# Patient Record
Sex: Female | Born: 1950 | Race: White | Hispanic: No | Smoking: Never smoker
Health system: Southern US, Community
[De-identification: ages and names within clinical notes are randomized; demographics above are authoritative.]

## PROBLEM LIST (undated history)

## (undated) DIAGNOSIS — N1832 Chronic kidney disease, stage 3b: Secondary | ICD-10-CM

## (undated) DIAGNOSIS — R0600 Dyspnea, unspecified: Secondary | ICD-10-CM

## (undated) DIAGNOSIS — E875 Hyperkalemia: Secondary | ICD-10-CM

## (undated) DIAGNOSIS — R519 Headache, unspecified: Secondary | ICD-10-CM

## (undated) DIAGNOSIS — E119 Type 2 diabetes mellitus without complications: Secondary | ICD-10-CM

## (undated) DIAGNOSIS — M1990 Unspecified osteoarthritis, unspecified site: Secondary | ICD-10-CM

## (undated) DIAGNOSIS — E785 Hyperlipidemia, unspecified: Secondary | ICD-10-CM

## (undated) DIAGNOSIS — I10 Essential (primary) hypertension: Secondary | ICD-10-CM

## (undated) HISTORY — DX: Hyperkalemia: E87.5

## (undated) HISTORY — PX: EYE SURGERY: SHX253

## (undated) HISTORY — DX: Chronic kidney disease, stage 3b: N18.32

## (undated) HISTORY — PX: CHOLECYSTECTOMY: SHX55

## (undated) HISTORY — PX: BLADDER SUSPENSION: SHX72

## (undated) HISTORY — PX: CATARACT EXTRACTION W/ INTRAOCULAR LENS  IMPLANT, BILATERAL: SHX1307

## (undated) HISTORY — PX: ABDOMINAL HYSTERECTOMY: SHX81

---

## 2004-11-06 ENCOUNTER — Ambulatory Visit: Payer: Self-pay | Admitting: Family Medicine

## 2006-04-01 ENCOUNTER — Other Ambulatory Visit: Payer: Self-pay

## 2006-04-01 ENCOUNTER — Emergency Department: Payer: Self-pay | Admitting: Emergency Medicine

## 2006-08-13 ENCOUNTER — Ambulatory Visit: Payer: Self-pay

## 2008-07-10 ENCOUNTER — Ambulatory Visit: Payer: Self-pay | Admitting: Obstetrics & Gynecology

## 2008-07-11 ENCOUNTER — Ambulatory Visit: Payer: Self-pay | Admitting: Obstetrics & Gynecology

## 2008-12-11 ENCOUNTER — Emergency Department: Payer: Self-pay | Admitting: Emergency Medicine

## 2009-09-15 HISTORY — PX: ABDOMINAL HYSTERECTOMY: SHX81

## 2010-09-15 HISTORY — PX: COLONOSCOPY: SHX174

## 2011-01-05 ENCOUNTER — Emergency Department: Payer: Self-pay | Admitting: Emergency Medicine

## 2011-01-14 ENCOUNTER — Ambulatory Visit: Payer: Self-pay | Admitting: Gastroenterology

## 2011-04-02 ENCOUNTER — Ambulatory Visit: Payer: Self-pay | Admitting: Family Medicine

## 2011-04-16 ENCOUNTER — Ambulatory Visit: Payer: Self-pay | Admitting: Family Medicine

## 2011-05-17 ENCOUNTER — Ambulatory Visit: Payer: Self-pay | Admitting: Family Medicine

## 2011-06-18 ENCOUNTER — Ambulatory Visit: Payer: Self-pay | Admitting: Gastroenterology

## 2011-06-26 ENCOUNTER — Ambulatory Visit: Payer: Self-pay | Admitting: Gastroenterology

## 2011-07-09 ENCOUNTER — Ambulatory Visit: Payer: Self-pay | Admitting: Family Medicine

## 2011-07-17 ENCOUNTER — Ambulatory Visit: Payer: Self-pay | Admitting: Family Medicine

## 2011-07-22 ENCOUNTER — Ambulatory Visit: Payer: Self-pay | Admitting: Surgery

## 2011-07-23 LAB — PATHOLOGY REPORT

## 2012-10-11 ENCOUNTER — Ambulatory Visit: Payer: Self-pay | Admitting: Ophthalmology

## 2012-10-18 ENCOUNTER — Ambulatory Visit: Payer: Self-pay | Admitting: Ophthalmology

## 2013-01-10 ENCOUNTER — Ambulatory Visit: Payer: Self-pay | Admitting: Ophthalmology

## 2014-03-28 ENCOUNTER — Ambulatory Visit: Payer: Self-pay | Admitting: Family Medicine

## 2014-04-28 ENCOUNTER — Ambulatory Visit: Payer: Self-pay | Admitting: Physician Assistant

## 2015-01-05 NOTE — Op Note (Signed)
PATIENT NAME:  Veronica Barajas, Veronica Barajas MR#:  [id number] DATE OF BIRTH:  1950/11/27  DATE OF PROCEDURE:  10/18/2012  PREOPERATIVE DIAGNOSIS:  Cataract, left eye.    POSTOPERATIVE DIAGNOSIS:  Cataract, left eye.  PROCEDURE PERFORMED:  Extracapsular cataract extraction using phacoemulsification with placement of an Alcon SN6CWS, 28.5-diopter posterior chamber lens, serial #[id number].  SURGEON:  Maylon PeppersSteven A. Noheli Melder, MD  ASSISTANT:  None.  ANESTHESIA:  4% lidocaine and 0.75% Marcaine in a 50/50 mixture with 10 units/mL of Vitrase added, given as a peribulbar.   ANESTHESIOLOGIST:  Randall AnGjibertus Van Staveren, MD  COMPLICATIONS:  None.  ESTIMATED BLOOD LOSS:  Less than 1 ml.  DESCRIPTION OF PROCEDURE:  The patient was brought to the operating room and given a peribulbar block.  The patient was then prepped and draped in the usual fashion.  The vertical rectus muscles were imbricated using 5-0 silk sutures.  These sutures were then clamped to the sterile drapes as bridle sutures.  A limbal peritomy was performed extending two clock hours and hemostasis was obtained with cautery.  A partial thickness scleral groove was made at the surgical limbus and dissected anteriorly in a lamellar dissection using an Alcon crescent knife.  The anterior chamber was entered supero-temporally with a Superblade and through the lamellar dissection with a 2.6 mm keratome.  DisCoVisc was used to replace the aqueous and a continuous tear capsulorrhexis was carried out.  Hydrodissection and hydrodelineation were carried out with balanced salt and a 27 gauge canula.  The nucleus was rotated to confirm the effectiveness of the hydrodissection.  Phacoemulsification was carried out using a divide-and-conquer technique.  Total ultrasound time was 1 minutes and 43 seconds with an average power of 24.4 percent and CDE of 50.50.   Irrigation/aspiration was used to remove the residual cortex.  DisCoVisc was used to inflate the capsule  and the internal incision was enlarged to 3 mm with the crescent knife.  The intraocular lens was folded and inserted into the capsular bag using the Ecolablcon Monarch shooter.  Irrigation/aspiration was used to remove the residual DisCoVisc.  Miostat was injected into the anterior chamber through the paracentesis track to inflate the anterior chamber and induce miosis.  The wound was checked for leaks and wound leakage was found.  A single 10-0 nylon suture was placed across the wound. The conjunctiva was closed with cautery and the bridle sutures were removed.  Two drops of 0.3% Vigamox were placed on the eye.   An eye shield was placed on the eye.  The patient was discharged to the recovery room in good condition. ____________________________ Maylon PeppersSteven A. Andray Assefa, MD sad:sb D: 10/18/2012 12:42:06 ET T: 10/18/2012 13:39:07 ET JOB#: [id number]  cc: Viviann SpareSteven A. Arpan Eskelson, MD, <Dictator> Erline LevineSTEVEN A Rhena Glace MD ELECTRONICALLY SIGNED 10/25/2012 13:09

## 2015-01-05 NOTE — Op Note (Signed)
PATIENT NAME:  Veronica Barajas, Veronica Barajas MR#:  [id number] DATE OF BIRTH:  1950-10-15  DATE OF PROCEDURE:  01/10/2013  PREOPERATIVE DIAGNOSIS:  Cataract, right eye.   POSTOPERATIVE DIAGNOSIS:  Cataract, right eye.  PROCEDURE PERFORMED:  Extracapsular cataract extraction using phacoemulsification with placement of an Alcon SN6CWS, 30.0-diopter posterior chamber lens, serial [id number].  SURGEON:  Maylon PeppersSteven A. Ada Woodbury, MD  ASSISTANT:  None.  ANESTHESIA:  4% lidocaine and 0.75% Marcaine in a 50/50 mixture with 10 units/mL of Hylenex added, given as a peribulbar.   ANESTHESIOLOGIST:  Dr. Pernell DupreAdams.  COMPLICATIONS:  None.  ESTIMATED BLOOD LOSS:  Less than 1 ml.  DESCRIPTION OF PROCEDURE:  The patient was brought to the operating room and given a peribulbar block.  The patient was then prepped and draped in the usual fashion.  The vertical rectus muscles were imbricated using 5-0 silk sutures.  These sutures were then clamped to the sterile drapes as bridle sutures.  A limbal peritomy was performed extending two clock hours and hemostasis was obtained with cautery.  A partial thickness scleral groove was made at the surgical limbus and dissected anteriorly in a lamellar dissection using an Alcon crescent knife.  The anterior chamber was entered superonasally with a Superblade and through the lamellar dissection with a 2.6 mm keratome.  DisCoVisc was used to replace the aqueous and a continuous tear capsulorrhexis was carried out.  Hydrodissection and hydrodelineation were carried out with balanced salt and a 27 gauge canula.  The nucleus was rotated to confirm the effectiveness of the hydrodissection.  Phacoemulsification was carried out using a divide-and-conquer technique.  Total ultrasound time was 39 seconds with an average power of 15.5 percent and CDE of 11.91.  Irrigation/aspiration was used to remove the residual cortex.  DisCoVisc was used to inflate the capsule and the internal incision was  enlarged to 3 mm with the crescent knife.  The intraocular lens was folded and inserted into the capsular bag using the AcrySert delivery system.  Irrigation/aspiration was used to remove the residual DisCoVisc.  Miostat was injected into the anterior chamber through the paracentesis track to inflate the anterior chamber and induce miosis. A tenth of a milliliter of cefuroxime was placed into the anterior chamber via the paracentesis tract. The wound was checked for leaks and wound leakage was found.  A single 10-0 suture was placed across the incision, tied and the knot was rotated superiorly.  The conjunctiva was closed with cautery and the bridle sutures were removed.  Two drops of 0.3% Vigamox were placed on the eye.   An eye shield was placed on the eye.  The patient was discharged to the recovery room in good condition. ____________________________ Maylon PeppersSteven A. Johnathon Olden, MD sad:sb D: 01/10/2013 14:13:27 ET T: 01/10/2013 14:57:25 ET JOB#: [id number]  cc: Viviann SpareSteven A. Avyana Puffenbarger, MD, <Dictator> Erline LevineSTEVEN A Mignonne Afonso MD ELECTRONICALLY SIGNED 01/17/2013 9:47

## 2015-04-03 ENCOUNTER — Other Ambulatory Visit: Payer: Self-pay | Admitting: Family Medicine

## 2015-04-03 DIAGNOSIS — Z1239 Encounter for other screening for malignant neoplasm of breast: Secondary | ICD-10-CM

## 2015-04-03 DIAGNOSIS — Z1231 Encounter for screening mammogram for malignant neoplasm of breast: Secondary | ICD-10-CM

## 2015-04-10 ENCOUNTER — Ambulatory Visit: Payer: Self-pay | Attending: Family Medicine

## 2016-07-10 ENCOUNTER — Other Ambulatory Visit: Payer: Self-pay | Admitting: Family Medicine

## 2016-07-10 DIAGNOSIS — Z1239 Encounter for other screening for malignant neoplasm of breast: Secondary | ICD-10-CM

## 2016-08-27 ENCOUNTER — Ambulatory Visit: Payer: Self-pay

## 2017-08-16 ENCOUNTER — Emergency Department
Admission: EM | Admit: 2017-08-16 | Discharge: 2017-08-16 | Disposition: A | Discharge status: Home / Self Care | Payer: 59 | Attending: Emergency Medicine | Admitting: Emergency Medicine

## 2017-08-16 ENCOUNTER — Encounter: Payer: Self-pay | Admitting: Emergency Medicine

## 2017-08-16 ENCOUNTER — Other Ambulatory Visit: Payer: Self-pay

## 2017-08-16 DIAGNOSIS — E119 Type 2 diabetes mellitus without complications: Secondary | ICD-10-CM | POA: Insufficient documentation

## 2017-08-16 DIAGNOSIS — R112 Nausea with vomiting, unspecified: Secondary | ICD-10-CM | POA: Insufficient documentation

## 2017-08-16 DIAGNOSIS — E860 Dehydration: Secondary | ICD-10-CM

## 2017-08-16 DIAGNOSIS — R197 Diarrhea, unspecified: Secondary | ICD-10-CM

## 2017-08-16 HISTORY — DX: Type 2 diabetes mellitus without complications: E11.9

## 2017-08-16 LAB — COMPREHENSIVE METABOLIC PANEL
ALBUMIN: 3.9 g/dL (ref 3.5–5.0)
ALT: 15 U/L (ref 14–54)
ANION GAP: 10 (ref 5–15)
AST: 25 U/L (ref 15–41)
Alkaline Phosphatase: 68 U/L (ref 38–126)
BILIRUBIN TOTAL: 0.4 mg/dL (ref 0.3–1.2)
BUN: 32 mg/dL — ABNORMAL HIGH (ref 6–20)
CHLORIDE: 105 mmol/L (ref 101–111)
CO2: 25 mmol/L (ref 22–32)
Calcium: 9.1 mg/dL (ref 8.9–10.3)
Creatinine, Ser: 1.17 mg/dL — ABNORMAL HIGH (ref 0.44–1.00)
GFR calc Af Amer: 55 mL/min — ABNORMAL LOW (ref >60)
GFR calc non Af Amer: 47 mL/min — ABNORMAL LOW (ref >60)
Glucose, Bld: 145 mg/dL — ABNORMAL HIGH (ref 65–99)
POTASSIUM: 4.3 mmol/L (ref 3.5–5.1)
SODIUM: 140 mmol/L (ref 135–145)
TOTAL PROTEIN: 7.3 g/dL (ref 6.5–8.1)

## 2017-08-16 LAB — CBC
HEMATOCRIT: 36.9 % (ref 35.0–47.0)
HEMOGLOBIN: 12.3 g/dL (ref 12.0–16.0)
MCH: 29.6 pg (ref 26.0–34.0)
MCHC: 33.3 g/dL (ref 32.0–36.0)
MCV: 89.1 fL (ref 80.0–100.0)
Platelets: 199 10*3/uL (ref 150–440)
RBC: 4.14 MIL/uL (ref 3.80–5.20)
RDW: 15.1 % — ABNORMAL HIGH (ref 11.5–14.5)
WBC: 9.8 10*3/uL (ref 3.6–11.0)

## 2017-08-16 LAB — LIPASE, BLOOD: LIPASE: 34 U/L (ref 11–51)

## 2017-08-16 MED ORDER — ONDANSETRON 4 MG PO TBDP: 4.0000 mg | ORAL_TABLET | Freq: Once | ORAL | Status: DC

## 2017-08-16 MED ORDER — ONDANSETRON 4 MG PO TBDP: 4.0000 mg | ORAL_TABLET | Freq: Three times a day (TID) | ORAL | 0 refills | Status: DC | PRN

## 2017-08-16 MED ORDER — SODIUM CHLORIDE 0.9 % IV BOLUS (SEPSIS)
1000.0000 mL | Freq: Once | INTRAVENOUS | Status: AC
  Administered 2017-08-16: 1000 mL via INTRAVENOUS

## 2017-08-16 MED ORDER — ONDANSETRON HCL 4 MG/2ML IJ SOLN
INTRAMUSCULAR | Status: AC
  Filled 2017-08-16: qty 2

## 2017-08-16 MED ORDER — ONDANSETRON HCL 4 MG/2ML IJ SOLN
4.0000 mg | Freq: Once | INTRAMUSCULAR | Status: AC
  Administered 2017-08-16: 4 mg via INTRAVENOUS

## 2017-08-16 MED ORDER — ONDANSETRON 4 MG PO TBDP
4.0000 mg | ORAL_TABLET | Freq: Once | ORAL | Status: AC | PRN
  Administered 2017-08-16: 4 mg via ORAL
  Filled 2017-08-16: qty 1

## 2017-08-16 NOTE — ED Triage Notes (Addendum)
Pt presents to ED with c/o fever, chills, lower abdominal cramping, nausea, vomiting, and diarrhea, that started earlier today. Pt states when symptoms started she was asleep when symptoms started. Pt reports 3-4 episodes of vomiting, "several episodes of diarrhea", pt denies any blood in either. Pt also c/o leg cramping bilaterally.

## 2017-08-16 NOTE — ED Notes (Addendum)
Pt reports possible ingestion of soft soap antibacterial soap more than 24 hrs ago, pt's family reports her grandson was playing with soap and squirted some in her juice that got spilled on her biscuit. Pt's family reports calling poison control and per family they were told by poison control that it was non-toxic.

## 2017-08-16 NOTE — ED Notes (Signed)
This RN contacted poison control at this time. Per poison control they were contacted by patient and family. Poison control states that soap can cause upset stomach however is not poisonous. No further recommendations at this time.

## 2017-08-16 NOTE — Discharge Instructions (Signed)
Please use Zofran as needed for severe symptoms and follow-up with your primary care physician tomorrow for reexamination.  Return to the emergency department sooner for any concerns whatsoever.  It was a pleasure to take care of you today, and thank you for coming to our emergency department.  If you have any questions or concerns before leaving please ask the nurse to grab me and I'm more than happy to go through your aftercare instructions again.  If you were prescribed any opioid pain medication today such as Norco, Vicodin, Percocet, morphine, hydrocodone, or oxycodone please make sure you do not drive when you are taking this medication as it can alter your ability to drive safely.  If you have any concerns once you are home that you are not improving or are in fact getting worse before you can make it to your follow-up appointment, please do not hesitate to call 911 and come back for further evaluation.  Merrily BrittleNeil Kayia Billinger, MD  Results for orders placed or performed during the hospital encounter of 08/16/17  Lipase, blood  Result Value Ref Range   Lipase 34 11 - 51 U/L  Comprehensive metabolic panel  Result Value Ref Range   Sodium 140 135 - 145 mmol/L   Potassium 4.3 3.5 - 5.1 mmol/L   Chloride 105 101 - 111 mmol/L   CO2 25 22 - 32 mmol/L   Glucose, Bld 145 (H) 65 - 99 mg/dL   BUN 32 (H) 6 - 20 mg/dL   Creatinine, Ser 1.611.17 (H) 0.44 - 1.00 mg/dL   Calcium 9.1 8.9 - 09.610.3 mg/dL   Total Protein 7.3 6.5 - 8.1 g/dL   Albumin 3.9 3.5 - 5.0 g/dL   AST 25 15 - 41 U/L   ALT 15 14 - 54 U/L   Alkaline Phosphatase 68 38 - 126 U/L   Total Bilirubin 0.4 0.3 - 1.2 mg/dL   GFR calc non Af Amer 47 (L) >60 mL/min   GFR calc Af Amer 55 (L) >60 mL/min   Anion gap 10 5 - 15  CBC  Result Value Ref Range   WBC 9.8 3.6 - 11.0 K/uL   RBC 4.14 3.80 - 5.20 MIL/uL   Hemoglobin 12.3 12.0 - 16.0 g/dL   HCT 04.536.9 40.935.0 - 81.147.0 %   MCV 89.1 80.0 - 100.0 fL   MCH 29.6 26.0 - 34.0 pg   MCHC 33.3 32.0 - 36.0  g/dL   RDW 91.415.1 (H) 78.211.5 - 95.614.5 %   Platelets 199 150 - 440 K/uL

## 2017-08-16 NOTE — ED Provider Notes (Signed)
[facility] Emergency Department Provider Note  ____________________________________________   First MD Initiated Contact with Patient 08/16/17 1933     (approximate)  I have reviewed the triage vital signs and the nursing notes.   HISTORY  Chief Complaint Nausea; Emesis; and Diarrhea    HPI Veronica Barajas is a 66 y.o. female who self presents to the emergency department with sudden onset severe nausea vomiting and diarrhea that began roughly 2 hours prior to arrival.  She ate a late breakfast and several hours thereafter got up to go to the bathroom she had profound watery diarrhea and vomiting.  Her husband ate the same breakfast and he feels queasy and unwell as well.  She has not had any diarrhea since arriving in the emergency department.  Her symptoms began suddenly were severe were nonradiating and seemed to abate on their own.  She denies fevers or chills.  She denies eating shellfish.  Past Medical History:  Diagnosis Date  . Diabetes mellitus without complication (HCC)     There are no active problems to display for this patient.   Past Surgical History:  Procedure Laterality Date  . ABDOMINAL HYSTERECTOMY    . BLADDER SUSPENSION    . CHOLECYSTECTOMY    . EYE SURGERY      Prior to Admission medications   Medication Sig Start Date End Date Taking? Authorizing Provider  ondansetron (ZOFRAN ODT) 4 MG disintegrating tablet Take 1 tablet (4 mg total) by mouth every 8 (eight) hours as needed for nausea or vomiting. 08/16/17   Merrily Brittleifenbark, Makayla Lanter, MD    Allergies Tetracyclines & related  History reviewed. No pertinent family history.  Social History Social History   Tobacco Use  . Smoking status: Never Smoker  . Smokeless tobacco: Never Used  Substance Use Topics  . Alcohol use: No    Frequency: Never  . Drug use: No    Review of Systems Constitutional: No fever/chills Eyes: No visual changes. ENT: No sore throat. Cardiovascular:  Denies chest pain. Respiratory: Denies shortness of breath. Gastrointestinal: Positive for abdominal pain.  Positive for nausea, positive for vomiting.  Positive for diarrhea.  No constipation. Genitourinary: Negative for dysuria. Musculoskeletal: Negative for back pain. Skin: Negative for rash. Neurological: Negative for headaches, focal weakness or numbness.   ____________________________________________   PHYSICAL EXAM:  VITAL SIGNS: ED Triage Vitals  Enc Vitals Group     BP 08/16/17 1830 (!) 111/30     Pulse Rate 08/16/17 1830 80     Resp 08/16/17 1830 18     Temp 08/16/17 1830 99.1 F (37.3 C)     Temp Source 08/16/17 1830 Oral     SpO2 08/16/17 1830 99 %     Weight 08/16/17 1830 183 lb (83 kg)     Height 08/16/17 1830 5\' 2"  (1.575 m)     Head Circumference --      Peak Flow --      Pain Score 08/16/17 1829 0     Pain Loc --      Pain Edu? --      Excl. in GC? --     Constitutional: Alert and oriented x4 well-appearing nontoxic no diaphoresis speaks full clear sentences Eyes: PERRL EOMI. Head: Atraumatic. Nose: No congestion/rhinnorhea. Mouth/Throat: No trismus Neck: No stridor.   Cardiovascular: Normal rate, regular rhythm. Grossly normal heart sounds.  Good peripheral circulation. Respiratory: Normal respiratory effort.  No retractions. Lungs CTAB and moving good air Gastrointestinal: Soft nondistended mild diffuse  tenderness with no focality no rebound or guarding no peritonitis no McBurney's tenderness and Rovsing's Musculoskeletal: No lower extremity edema   Neurologic:  Normal speech and language. No gross focal neurologic deficits are appreciated. Skin:  Skin is warm, dry and intact. No rash noted. Psychiatric: Mood and affect are normal. Speech and behavior are normal.    ____________________________________________   DIFFERENTIAL includes but not limited to  Dehydration, food poisoning, appendicitis, diverticulitis,  pyelonephritis ____________________________________________   LABS (all labs ordered are listed, but only abnormal results are displayed)  Labs Reviewed  COMPREHENSIVE METABOLIC PANEL - Abnormal; Notable for the following components:      Result Value   Glucose, Bld 145 (*)    BUN 32 (*)    Creatinine, Ser 1.17 (*)    GFR calc non Af Amer 47 (*)    GFR calc Af Amer 55 (*)    All other components within normal limits  CBC - Abnormal; Notable for the following components:   RDW 15.1 (*)    All other components within normal limits  LIPASE, BLOOD  URINALYSIS, COMPLETE (UACMP) WITH MICROSCOPIC    Blood work reviewed by me shows slight decrease in GFR consistent with dehydration __________________________________________  EKG   ____________________________________________  RADIOLOGY   ____________________________________________   PROCEDURES  Procedure(s) performed: no  Procedures  Critical Care performed: no  Observation: no ____________________________________________   INITIAL IMPRESSION / ASSESSMENT AND PLAN / ED COURSE  Pertinent labs & imaging results that were available during my care of the patient were reviewed by me and considered in my medical decision making (see chart for details).  On arrival the patient is somewhat uncomfortable appearing but overall very well.  Her abdomen is nonfocal.  Labs are consistent with dehydration.  Profound watery diarrhea and vomiting shortly after eating along with similar symptoms and her husband is most consistent with replacing.  We will treat her symptomatically with Zofran and IV fluids and anticipate discharge home.     ----------------------------------------- 8:55 PM on 08/16/2017 -----------------------------------------  After 1 L of fluid the patient's symptoms are nearly completely resolved.  She is able to eat and drink.  I will discharge her home with a short course of Zofran and strict return  precautions have been given.  The patient verbalized understanding and agreement the plan. ____________________________________________   FINAL CLINICAL IMPRESSION(S) / ED DIAGNOSES  Final diagnoses:  Dehydration  Nausea vomiting and diarrhea      NEW MEDICATIONS STARTED DURING THIS VISIT:  This SmartLink is deprecated. Use AVSMEDLIST instead to display the medication list for a patient.   Note:  This document was prepared using Dragon voice recognition software and may include unintentional dictation errors.     Merrily Brittleifenbark, Mazell Aylesworth, MD 08/16/17 2055

## 2017-08-20 LAB — GLUCOSE, CAPILLARY: GLUCOSE-CAPILLARY: 137 mg/dL — AB (ref 65–99)

## 2017-09-02 ENCOUNTER — Other Ambulatory Visit: Payer: Self-pay | Admitting: Family Medicine

## 2017-09-02 DIAGNOSIS — Z1239 Encounter for other screening for malignant neoplasm of breast: Secondary | ICD-10-CM

## 2018-03-12 ENCOUNTER — Ambulatory Visit
Admission: RE | Admit: 2018-03-12 | Discharge: 2018-03-12 | Disposition: A | Discharge status: Home / Self Care | Payer: 59 | Source: Ambulatory Visit | Attending: Family Medicine | Admitting: Family Medicine

## 2018-03-12 DIAGNOSIS — Z1231 Encounter for screening mammogram for malignant neoplasm of breast: Secondary | ICD-10-CM | POA: Insufficient documentation

## 2018-03-12 DIAGNOSIS — Z1239 Encounter for other screening for malignant neoplasm of breast: Secondary | ICD-10-CM

## 2018-10-22 ENCOUNTER — Other Ambulatory Visit: Payer: Self-pay | Admitting: Family Medicine

## 2018-10-22 DIAGNOSIS — Z1231 Encounter for screening mammogram for malignant neoplasm of breast: Secondary | ICD-10-CM

## 2019-03-14 ENCOUNTER — Ambulatory Visit
Admission: RE | Admit: 2019-03-14 | Discharge: 2019-03-14 | Disposition: A | Discharge status: Home / Self Care | Payer: Managed Care, Other (non HMO) | Source: Ambulatory Visit | Attending: Family Medicine | Admitting: Family Medicine

## 2019-03-14 ENCOUNTER — Other Ambulatory Visit: Payer: Self-pay

## 2019-03-14 DIAGNOSIS — Z1231 Encounter for screening mammogram for malignant neoplasm of breast: Secondary | ICD-10-CM | POA: Diagnosis not present

## 2019-11-05 ENCOUNTER — Ambulatory Visit: Payer: Managed Care, Other (non HMO) | Attending: Internal Medicine

## 2019-11-05 DIAGNOSIS — Z23 Encounter for immunization: Secondary | ICD-10-CM

## 2019-11-05 NOTE — Progress Notes (Signed)
   Covid-19 Vaccination Clinic  Name:  Veronica Barajas    MRN: [id number] DOB: May 12, 1951  11/05/2019  [name] was observed post Covid-19 immunization for 15 minutes without incidence. She was provided with Vaccine Information Sheet and instruction to access the V-Safe system.   [name] was instructed to call 911 with any severe reactions post vaccine: Marland Kitchen Difficulty breathing  . Swelling of your face and throat  . A fast heartbeat  . A bad rash all over your body  . Dizziness and weakness    Immunizations Administered    Name Date Dose VIS Date Route   Pfizer COVID-19 Vaccine 11/05/2019 11:51 AM 0.3 mL 08/26/2019 Intramuscular   Manufacturer: [employer], [employer]   Lot: [id number]   NDC: [address]-2

## 2019-11-29 ENCOUNTER — Ambulatory Visit: Payer: Managed Care, Other (non HMO) | Attending: Internal Medicine

## 2019-11-29 DIAGNOSIS — Z23 Encounter for immunization: Secondary | ICD-10-CM

## 2019-11-29 NOTE — Progress Notes (Signed)
   Covid-19 Vaccination Clinic  Name:  Veronica Barajas    MRN: [id number] DOB: 07-20-1951  11/29/2019  Veronica Barajas was observed post Covid-19 immunization for 15 minutes without incident. She was provided with Vaccine Information Sheet and instruction to access the V-Safe system.   Veronica Barajas was instructed to call 911 with any severe reactions post vaccine: Marland Kitchen Difficulty breathing  . Swelling of face and throat  . A fast heartbeat  . A bad rash all over body  . Dizziness and weakness   Immunizations Administered    Name Date Dose VIS Date Route   Pfizer COVID-19 Vaccine 11/29/2019 12:20 PM 0.3 mL 08/26/2019 Intramuscular   Manufacturer: [employer], [employer]   Lot: [id number]   NDC: [address]-5

## 2021-01-01 ENCOUNTER — Other Ambulatory Visit: Payer: Self-pay | Admitting: Family Medicine

## 2021-01-01 DIAGNOSIS — Z1231 Encounter for screening mammogram for malignant neoplasm of breast: Secondary | ICD-10-CM

## 2021-01-15 ENCOUNTER — Ambulatory Visit
Admission: RE | Admit: 2021-01-15 | Discharge: 2021-01-15 | Disposition: A | Discharge status: Home / Self Care | Payer: Managed Care, Other (non HMO) | Source: Ambulatory Visit | Attending: Family Medicine | Admitting: Family Medicine

## 2021-01-15 ENCOUNTER — Other Ambulatory Visit: Payer: Self-pay

## 2021-01-15 DIAGNOSIS — Z1231 Encounter for screening mammogram for malignant neoplasm of breast: Secondary | ICD-10-CM | POA: Insufficient documentation

## 2021-05-26 IMAGING — MG MM DIGITAL SCREENING BILAT W/ TOMO AND CAD
6 of 10 series · 6 of 30 positions shown · non-contrast
Comparison: Previous exam(s).

CLINICAL DATA: Screening.

EXAM:
DIGITAL SCREENING BILATERAL MAMMOGRAM WITH TOMOSYNTHESIS AND CAD
TECHNIQUE: Bilateral screening digital craniocaudal and mediolateral oblique
mammograms were obtained. Bilateral screening digital breast
tomosynthesis was performed. The images were evaluated with
computer-aided detection.

[L MLO synth-2D (1 of 2)]
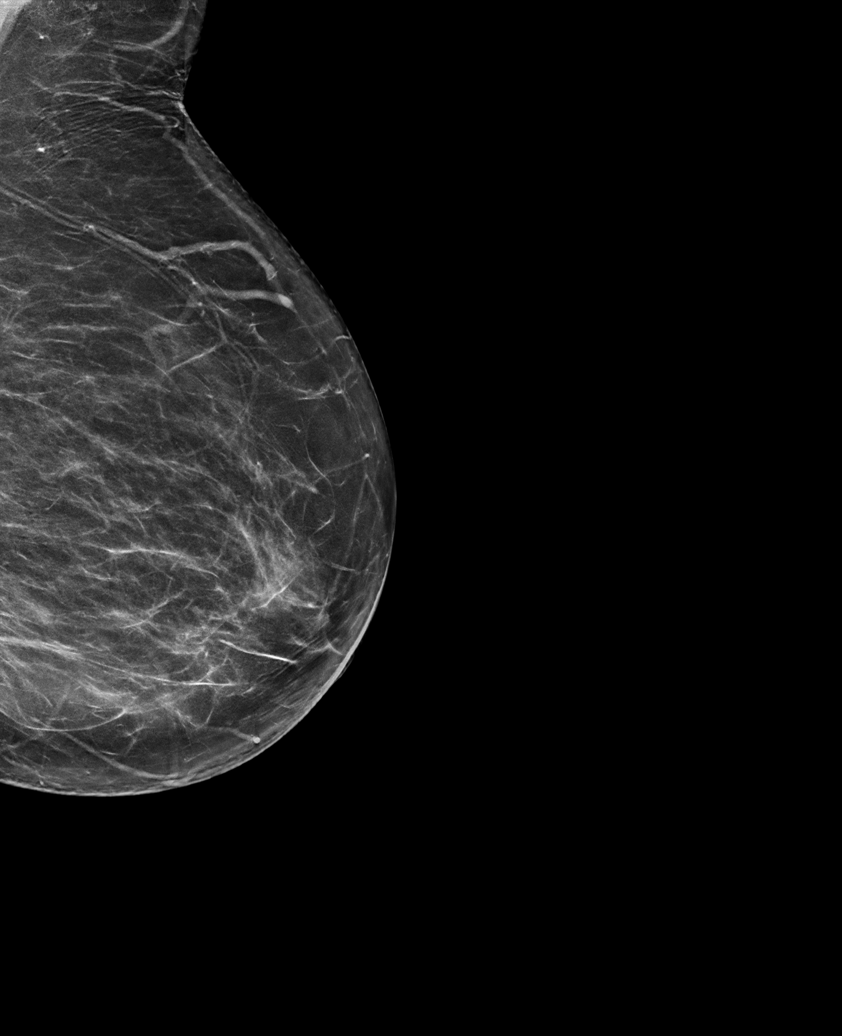

[L CC synth-2D]
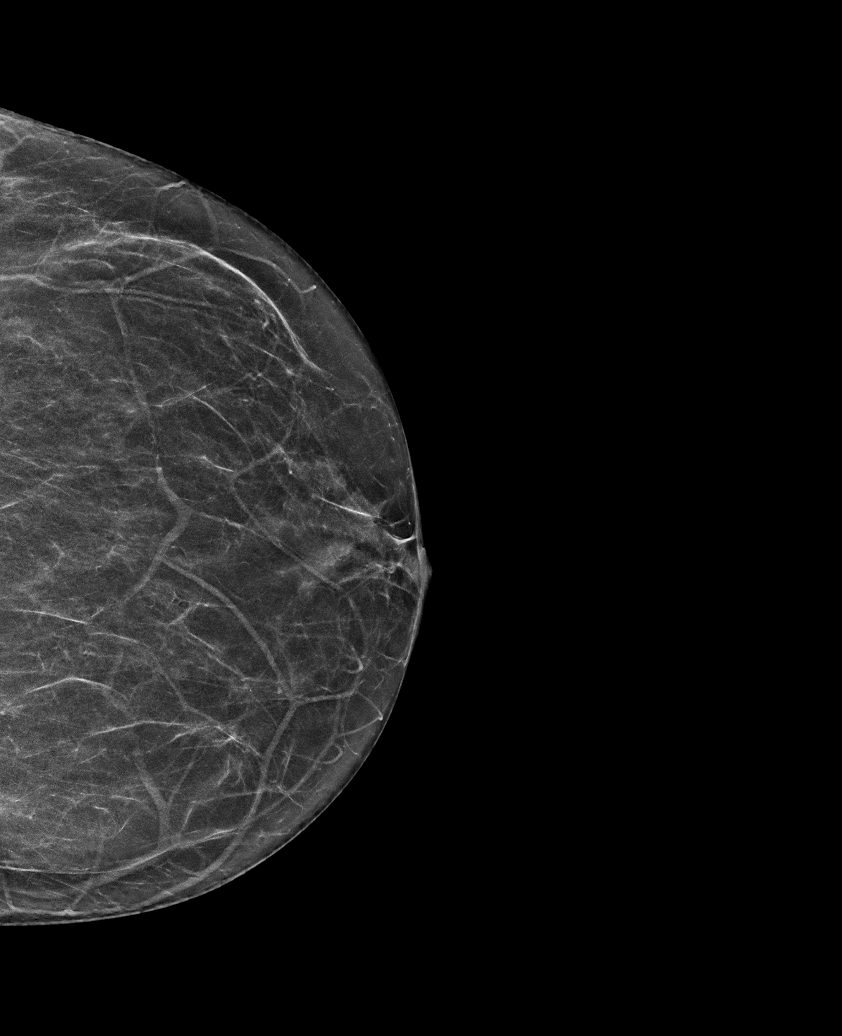

[R MLO synth-2D]
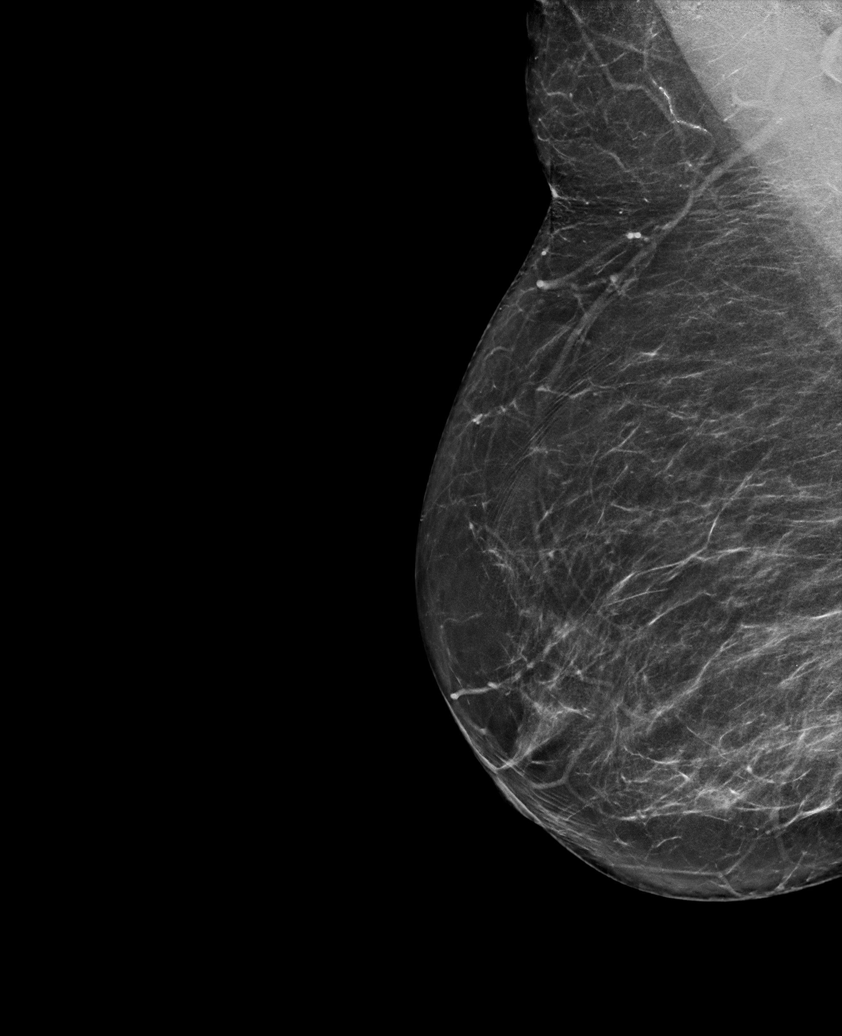

[L MLO synth-2D (2 of 2)]
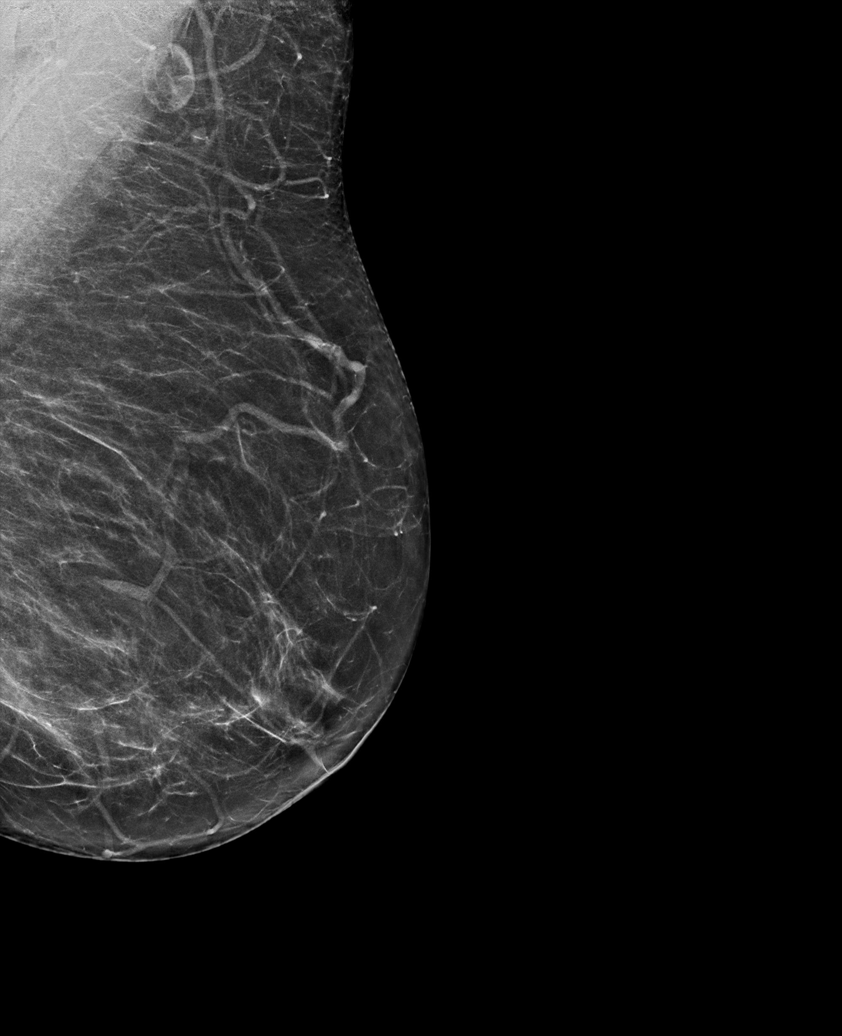

[R CC synth-2D]
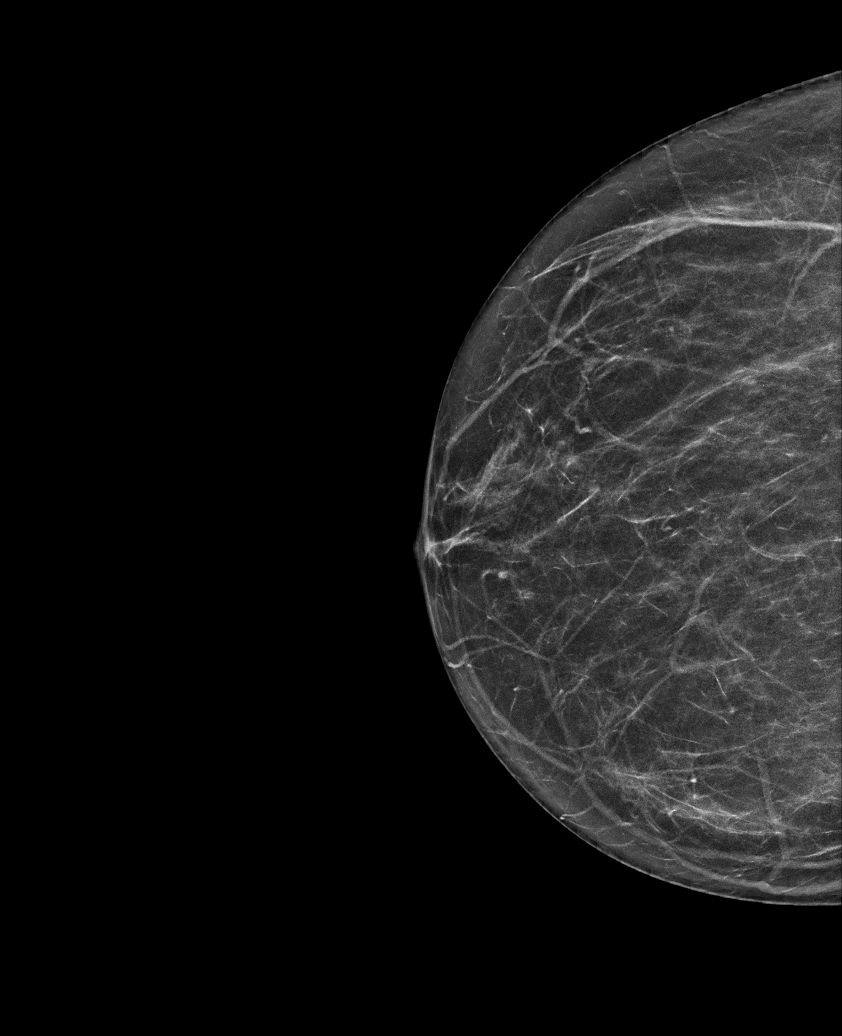

[L MLO tomo · tomo slice 37/72.0]
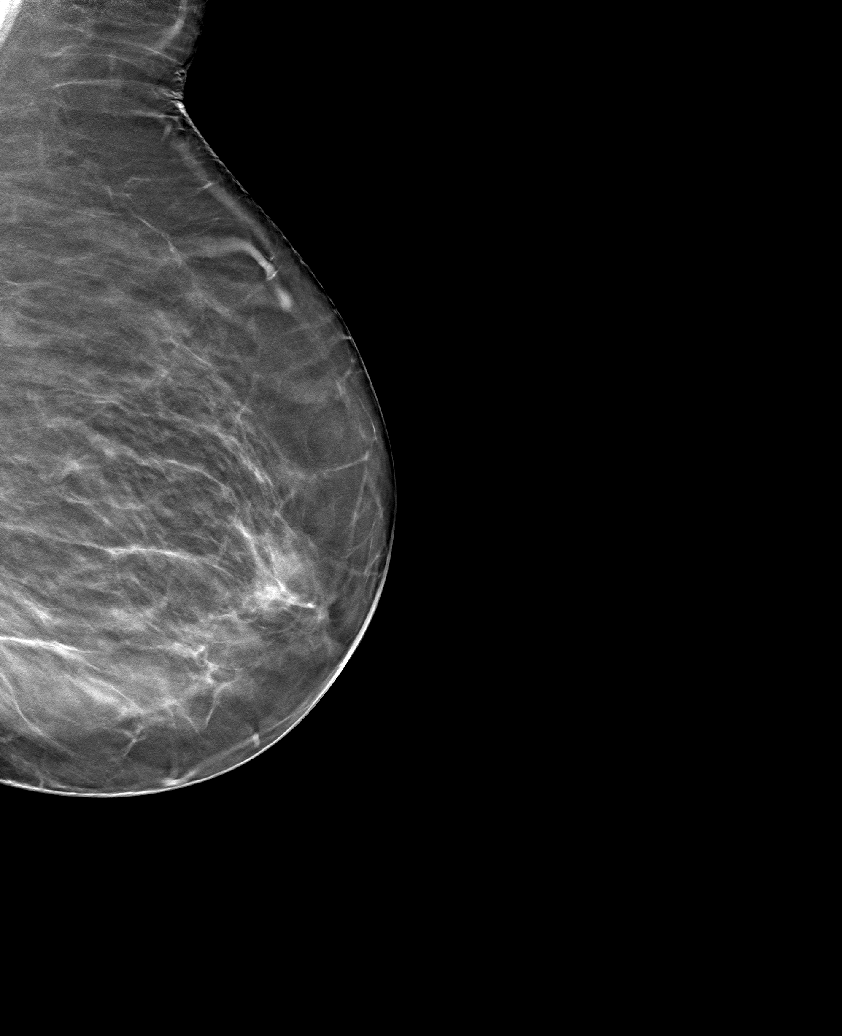

[6 of 30 positions shown; findings below may reference images not displayed]

ACR Breast Density Category b: There are scattered areas of
fibroglandular density.
FINDINGS: There are no findings suspicious for malignancy. The images were
evaluated with computer-aided detection.
IMPRESSION: No mammographic evidence of malignancy. A result letter of this
screening mammogram will be mailed directly to the patient.

RECOMMENDATION:
Screening mammogram in one year. (Code:[id number])

BI-RADS CATEGORY  1: Negative.

## 2021-06-17 ENCOUNTER — Ambulatory Visit: Admission: RE | Admit: 2021-06-17 | Payer: Managed Care, Other (non HMO) | Source: Home / Self Care

## 2021-06-17 ENCOUNTER — Encounter: Admission: RE | Payer: Self-pay | Source: Home / Self Care

## 2021-06-17 SURGERY — COLONOSCOPY
Anesthesia: General

## 2021-10-14 ENCOUNTER — Ambulatory Visit: Admit: 2021-10-14 | Discharge status: Absent without leave | Payer: Self-pay

## 2022-02-26 DIAGNOSIS — Z1231 Encounter for screening mammogram for malignant neoplasm of breast: Secondary | ICD-10-CM | POA: Diagnosis not present

## 2022-02-26 DIAGNOSIS — I10 Essential (primary) hypertension: Secondary | ICD-10-CM | POA: Diagnosis not present

## 2022-02-26 DIAGNOSIS — Z0000 Encounter for general adult medical examination without abnormal findings: Secondary | ICD-10-CM | POA: Diagnosis not present

## 2022-02-26 DIAGNOSIS — K219 Gastro-esophageal reflux disease without esophagitis: Secondary | ICD-10-CM | POA: Diagnosis not present

## 2022-02-26 DIAGNOSIS — E119 Type 2 diabetes mellitus without complications: Secondary | ICD-10-CM | POA: Diagnosis not present

## 2022-02-26 DIAGNOSIS — E785 Hyperlipidemia, unspecified: Secondary | ICD-10-CM | POA: Diagnosis not present

## 2022-02-26 DIAGNOSIS — M1990 Unspecified osteoarthritis, unspecified site: Secondary | ICD-10-CM | POA: Diagnosis not present

## 2022-02-26 DIAGNOSIS — R32 Unspecified urinary incontinence: Secondary | ICD-10-CM | POA: Diagnosis not present

## 2022-02-26 DIAGNOSIS — N1832 Chronic kidney disease, stage 3b: Secondary | ICD-10-CM | POA: Diagnosis not present

## 2022-02-27 DIAGNOSIS — K219 Gastro-esophageal reflux disease without esophagitis: Secondary | ICD-10-CM | POA: Diagnosis not present

## 2022-02-27 DIAGNOSIS — E119 Type 2 diabetes mellitus without complications: Secondary | ICD-10-CM | POA: Diagnosis not present

## 2022-02-27 DIAGNOSIS — E785 Hyperlipidemia, unspecified: Secondary | ICD-10-CM | POA: Diagnosis not present

## 2022-02-27 DIAGNOSIS — M1990 Unspecified osteoarthritis, unspecified site: Secondary | ICD-10-CM | POA: Diagnosis not present

## 2022-02-27 DIAGNOSIS — N1832 Chronic kidney disease, stage 3b: Secondary | ICD-10-CM | POA: Diagnosis not present

## 2022-02-27 DIAGNOSIS — I10 Essential (primary) hypertension: Secondary | ICD-10-CM | POA: Diagnosis not present

## 2022-02-27 DIAGNOSIS — Z1231 Encounter for screening mammogram for malignant neoplasm of breast: Secondary | ICD-10-CM | POA: Diagnosis not present

## 2022-02-27 DIAGNOSIS — Z0000 Encounter for general adult medical examination without abnormal findings: Secondary | ICD-10-CM | POA: Diagnosis not present

## 2022-02-27 DIAGNOSIS — R32 Unspecified urinary incontinence: Secondary | ICD-10-CM | POA: Diagnosis not present

## 2022-02-28 ENCOUNTER — Other Ambulatory Visit: Payer: Self-pay | Admitting: Family Medicine

## 2022-02-28 DIAGNOSIS — Z1231 Encounter for screening mammogram for malignant neoplasm of breast: Secondary | ICD-10-CM

## 2022-03-07 ENCOUNTER — Encounter: Payer: Self-pay | Admitting: *Deleted

## 2022-03-14 DIAGNOSIS — M170 Bilateral primary osteoarthritis of knee: Secondary | ICD-10-CM | POA: Diagnosis not present

## 2022-04-28 DIAGNOSIS — M1711 Unilateral primary osteoarthritis, right knee: Secondary | ICD-10-CM | POA: Diagnosis not present

## 2022-05-12 DIAGNOSIS — Z1231 Encounter for screening mammogram for malignant neoplasm of breast: Secondary | ICD-10-CM | POA: Diagnosis not present

## 2022-05-26 ENCOUNTER — Ambulatory Visit: Payer: Self-pay | Admitting: Urology

## 2022-06-04 ENCOUNTER — Other Ambulatory Visit: Payer: Self-pay | Admitting: Surgery

## 2022-06-12 ENCOUNTER — Ambulatory Visit: Payer: HMO | Admitting: Certified Registered Nurse Anesthetist

## 2022-06-12 ENCOUNTER — Encounter
Admission: RE | Admit: 2022-06-12 | Discharge: 2022-06-12 | Disposition: A | Discharge status: Home / Self Care | Payer: HMO | Source: Ambulatory Visit | Attending: Surgery | Admitting: Surgery

## 2022-06-12 ENCOUNTER — Ambulatory Visit: Payer: HMO | Admitting: Urgent Care

## 2022-06-12 VITALS — BP 126/41 | HR 55 | Resp 14 | Ht 62.0 in | Wt 170.0 lb

## 2022-06-12 DIAGNOSIS — E119 Type 2 diabetes mellitus without complications: Secondary | ICD-10-CM | POA: Diagnosis not present

## 2022-06-12 DIAGNOSIS — Z01818 Encounter for other preprocedural examination: Secondary | ICD-10-CM | POA: Insufficient documentation

## 2022-06-12 DIAGNOSIS — R001 Bradycardia, unspecified: Secondary | ICD-10-CM | POA: Insufficient documentation

## 2022-06-12 DIAGNOSIS — R8290 Unspecified abnormal findings in urine: Secondary | ICD-10-CM | POA: Diagnosis not present

## 2022-06-12 DIAGNOSIS — Z01812 Encounter for preprocedural laboratory examination: Secondary | ICD-10-CM

## 2022-06-12 HISTORY — DX: Headache, unspecified: R51.9

## 2022-06-12 HISTORY — DX: Essential (primary) hypertension: I10

## 2022-06-12 HISTORY — DX: Dyspnea, unspecified: R06.00

## 2022-06-12 HISTORY — DX: Unspecified osteoarthritis, unspecified site: M19.90

## 2022-06-12 HISTORY — DX: Hyperlipidemia, unspecified: E78.5

## 2022-06-12 LAB — COMPREHENSIVE METABOLIC PANEL
ALT: 10 U/L (ref 0–44)
AST: 21 U/L (ref 15–41)
Albumin: 3.7 g/dL (ref 3.5–5.0)
Alkaline Phosphatase: 55 U/L (ref 38–126)
Anion gap: 7 (ref 5–15)
BUN: 35 mg/dL — ABNORMAL HIGH (ref 8–23)
CO2: 25 mmol/L (ref 22–32)
Calcium: 9.4 mg/dL (ref 8.9–10.3)
Chloride: 108 mmol/L (ref 98–111)
Creatinine, Ser: 1.3 mg/dL — ABNORMAL HIGH (ref 0.44–1.00)
GFR, Estimated: 44 mL/min — ABNORMAL LOW (ref >60)
Glucose, Bld: 84 mg/dL (ref 70–99)
Potassium: 5 mmol/L (ref 3.5–5.1)
Sodium: 140 mmol/L (ref 135–145)
Total Bilirubin: 0.6 mg/dL (ref 0.3–1.2)
Total Protein: 7.2 g/dL (ref 6.5–8.1)

## 2022-06-12 LAB — CBC WITH DIFFERENTIAL/PLATELET
Abs Immature Granulocytes: 0.01 10*3/uL (ref 0.00–0.07)
Basophils Absolute: 0 10*3/uL (ref 0.0–0.1)
Basophils Relative: 1 %
Eosinophils Absolute: 0.2 10*3/uL (ref 0.0–0.5)
Eosinophils Relative: 4 %
HCT: 32.9 % — ABNORMAL LOW (ref 36.0–46.0)
Hemoglobin: 10.1 g/dL — ABNORMAL LOW (ref 12.0–15.0)
Immature Granulocytes: 0 %
Lymphocytes Relative: 28 %
Lymphs Abs: 1.3 10*3/uL (ref 0.7–4.0)
MCH: 28.7 pg (ref 26.0–34.0)
MCHC: 30.7 g/dL (ref 30.0–36.0)
MCV: 93.5 fL (ref 80.0–100.0)
Monocytes Absolute: 0.7 10*3/uL (ref 0.1–1.0)
Monocytes Relative: 14 %
Neutro Abs: 2.6 10*3/uL (ref 1.7–7.7)
Neutrophils Relative %: 53 %
Platelets: 194 10*3/uL (ref 150–400)
RBC: 3.52 MIL/uL — ABNORMAL LOW (ref 3.87–5.11)
RDW: 14.8 % (ref 11.5–15.5)
WBC: 4.8 10*3/uL (ref 4.0–10.5)
nRBC: 0 % (ref 0.0–0.2)

## 2022-06-12 LAB — TYPE AND SCREEN
ABO/RH(D): O POS
Antibody Screen: NEGATIVE

## 2022-06-12 LAB — URINALYSIS, ROUTINE W REFLEX MICROSCOPIC
Bilirubin Urine: NEGATIVE
Glucose, UA: NEGATIVE mg/dL
Hgb urine dipstick: NEGATIVE
Ketones, ur: NEGATIVE mg/dL
Nitrite: NEGATIVE
Protein, ur: NEGATIVE mg/dL
Specific Gravity, Urine: 1.015 (ref 1.005–1.030)
Squamous Epithelial / HPF: NONE SEEN (ref 0–5)
pH: 5 (ref 5.0–8.0)

## 2022-06-12 LAB — SURGICAL PCR SCREEN
MRSA, PCR: NEGATIVE
Staphylococcus aureus: NEGATIVE

## 2022-06-12 NOTE — Patient Instructions (Signed)
Your procedure is scheduled on: 06/24/22 Report to [place]. To find out your arrival time please call [phone] between 1PM - 3PM on 06/23/22.  Remember: Instructions that are not followed completely may result in serious medical risk, up to and including death, or upon the discretion of your surgeon and anesthesiologist your surgery may need to be rescheduled.     _X__ 1. Do not eat food after midnight the night before your procedure.                 No gum chewing or hard candies. You may drink clear liquids up to 2 hours                 before you are scheduled to arrive for your surgery- DO not drink clear                 liquids within 2 hours of the start of your surgery.                 Clear Liquids include:   Diabetics water only  Drink the G2 Gatorade 2 hours prior to arrival for surgery   __X__2.  On the morning of surgery brush your teeth with toothpaste and water, you                 may rinse your mouth with mouthwash if you wish.  Do not swallow any              toothpaste of mouthwash.     _X__ 3.  No Alcohol for 24 hours before or after surgery.   _X__ 4.  Do Not Smoke or use e-cigarettes For 24 Hours Prior to Your Surgery.                 Do not use any chewable tobacco products for at least 6 hours prior to                 surgery.  ____  5.  Bring all medications with you on the day of surgery if instructed.   __X__  6.  Notify your doctor if there is any change in your medical condition      (cold, fever, infections).     Do not wear jewelry, make-up, hairpins, clips or nail polish. Do not wear lotions, powders, or perfumes. You may wear deodorant Do not shave body hair 48 hours prior to surgery. Men may shave face and neck. Do not bring valuables to the hospital.    [facility] is not responsible for any belongings or valuables.  Contacts, dentures/partials or body piercings may not be worn into  surgery. Bring a case for your contacts, glasses or hearing aids, a denture cup will be supplied. Leave your suitcase in the car. After surgery it may be brought to your room. For patients admitted to the hospital, discharge time is determined by your treatment team.   Patients discharged the day of surgery will not be allowed to drive home.   Please read over the following fact sheets that you were given:   MRSA Information, CHG soap, Incentive Spirometer  __X__ Take these medicines the morning of surgery with A SIP OF WATER:    1. none  2.   3.   4.  5.  6.  ____ Fleet Enema (as directed)   __X__ Use CHG Soap/SAGE wipes as directed  ____ Use inhalers  on the day of surgery  __X__ Stop metformin/Janumet/Farxiga 2 days prior to surgery  Your last dose of Metformin will be on Sunday 06/22/22  ____ Take 1/2 of usual insulin dose the night before surgery. No insulin the morning          of surgery.   ____ Stop Blood Thinners Coumadin/Plavix/Xarelto/Pleta/Pradaxa/Eliquis/Effient/Aspirin  on   Or contact your Surgeon, Cardiologist or Medical Doctor regarding  ability to stop your blood thinners  __X__ Stop Anti-inflammatories 7 days before surgery such as Advil, Ibuprofen, Motrin,  BC or Goodies Powder, Naprosyn, Naproxen, Aleve, Aspirin    __X__ Stop all herbals and supplements, fish oil or vitamins  until after surgery.  You may continue your probiotic  ____ Bring C-Pap to the hospital.

## 2022-06-14 LAB — URINE CULTURE: Culture: 40000 — AB

## 2022-06-16 DIAGNOSIS — M1711 Unilateral primary osteoarthritis, right knee: Secondary | ICD-10-CM | POA: Diagnosis not present

## 2022-06-17 ENCOUNTER — Telehealth: Payer: Self-pay | Admitting: Urgent Care

## 2022-06-17 DIAGNOSIS — Z01812 Encounter for preprocedural laboratory examination: Secondary | ICD-10-CM

## 2022-06-17 DIAGNOSIS — N390 Urinary tract infection, site not specified: Secondary | ICD-10-CM

## 2022-06-17 MED ORDER — SULFAMETHOXAZOLE-TRIMETHOPRIM 800-160 MG PO TABS: 1.0000 | ORAL_TABLET | Freq: Two times a day (BID) | ORAL | 0 refills | Status: AC

## 2022-06-17 NOTE — Progress Notes (Signed)
[facility] Perioperative Services: Pre-Admission/Anesthesia Testing  Abnormal Lab Notification and Treatment Plan of Care   Date: 06/17/22  Name: Veronica Barajas MRN:   [id number]  Re: Abnormal labs noted during PAT appointment   Notified:  Provider Name Provider Role Notification Mode  Poggi, Jonny Ruiz, MD Orthopedics Routed and/or faxed via [facility]   Abnormal Lab Value(s):   Lab Results  Component Value Date   COLORURINE YELLOW (A) 06/12/2022   APPEARANCEUR HAZY (A) 06/12/2022   LABSPEC 1.015 06/12/2022   PHURINE 5.0 06/12/2022   GLUCOSEU NEGATIVE 06/12/2022   HGBUR NEGATIVE 06/12/2022   BILIRUBINUR NEGATIVE 06/12/2022   KETONESUR NEGATIVE 06/12/2022   PROTEINUR NEGATIVE 06/12/2022   NITRITE NEGATIVE 06/12/2022   LEUKOCYTESUR TRACE (A) 06/12/2022   EPIU NONE SEEN 06/12/2022   WBCU 11-20 06/12/2022   RBCU 0-5 06/12/2022   BACTERIA RARE (A) 06/12/2022   CULT 40,000 COLONIES/mL ESCHERICHIA COLI (A) 06/12/2022   Clinical Information and Notes:  Patient is scheduled for an elective RIGHT TOTAL KNEE ARTHROPLASTY on 06/24/2022  UA performed in PAT consistent with/concerning for infection.  No leukocytosis noted on CBC; WBC 4800 Renal function: Estimated Creatinine Clearance: 38.2 mL/min (A) (by C-G formula based on SCr of 1.3 mg/dL (H)). Urine C&S added to assess for pathogenically significant growth.  Impression and Plan:  Veronica Barajas with a UA that was (+) for infection; reflex culture sent. Reached out to surgeon to discuss as this patient is scheduled to undergo a total joint replacement. Bacteruria predisposes patient to higher postoperative complication rates, as prosthetic joints have been shown to have an affinity towards bacterial growth. In the setting of total knee arthroplasty, Dr. Joice Lofts would like to proceed with prophylactic antimicrobial coverage for this patient to reduce her chances of developing a postoperative PJI.   Contacted patient to  discuss. Patient reporting that she has some mild frequency, but no other symptoms. Patient denies fevers, abdominal pain and back pain. Patient with surgery scheduled soon. In efforts to avoid delaying patient's procedure, or have her experience any potentially significant perioperative complications related to the aforementioned, I would like to proceed with empiric treatment for urinary tract infection.  Allergies reviewed. Culture report also reviewed to ensure culture appropriate coverage is being provided. Will treat with a 5 day course of SMZ-TMP DS. Patient encouraged to complete the entire course of antibiotics even if she begins to feel better.   Meds ordered this encounter  Medications   sulfamethoxazole-trimethoprim (BACTRIM DS) 800-160 MG tablet    Sig: Take 1 tablet by mouth 2 (two) times daily for 5 days.    Dispense:  10 tablet    Refill:  0   Patient encouraged to increase her fluid intake as much as possible. Discussed that water is always best to flush the urinary tract. She was advised to avoid caffeine containing fluids until her infections clears, as caffeine can cause her to experience painful bladder spasms.   May use Tylenol as needed for pain/fever should she experience these symptoms.   Patient instructed to call surgeon's office or PAT with any questions or concerns related to the above outlined course of treatment. Additionally, she was instructed to call if she feels like she is getting worse overall while on treatment. Results and treatment plan of care forwarded to primary attending surgeon to make them aware.   Encounter Diagnoses  Name Primary?   Pre-operative laboratory examination Yes   E. coli UTI (urinary tract infection)    Judie Grieve  Pearline Cables, MSN, APRN, FNP-C, [place]  Peri-operative Services Nurse Practitioner Phone: [phone] Fax: [phone] 06/17/22 3:00 PM  NOTE: This note has been prepared using Dragon dictation  software. Despite my best ability to proofread, there is always the potential that unintentional transcriptional errors may still occur from this process.

## 2022-06-24 ENCOUNTER — Observation Stay
Admission: RE | Admit: 2022-06-24 | Discharge: 2022-06-25 | Disposition: A | Discharge status: Home / Self Care | Payer: HMO | Attending: Internal Medicine | Admitting: Internal Medicine

## 2022-06-24 ENCOUNTER — Encounter: Payer: Self-pay | Admitting: Surgery

## 2022-06-24 ENCOUNTER — Other Ambulatory Visit: Payer: Self-pay

## 2022-06-24 ENCOUNTER — Encounter
Admission: RE | Disposition: A | Discharge status: Home / Self Care | Payer: Self-pay | Source: Home / Self Care | Attending: Internal Medicine

## 2022-06-24 DIAGNOSIS — M25561 Pain in right knee: Secondary | ICD-10-CM | POA: Diagnosis not present

## 2022-06-24 DIAGNOSIS — E119 Type 2 diabetes mellitus without complications: Secondary | ICD-10-CM

## 2022-06-24 DIAGNOSIS — Z79899 Other long term (current) drug therapy: Secondary | ICD-10-CM | POA: Insufficient documentation

## 2022-06-24 DIAGNOSIS — R197 Diarrhea, unspecified: Secondary | ICD-10-CM | POA: Diagnosis not present

## 2022-06-24 DIAGNOSIS — G8929 Other chronic pain: Secondary | ICD-10-CM

## 2022-06-24 DIAGNOSIS — E875 Hyperkalemia: Secondary | ICD-10-CM | POA: Diagnosis not present

## 2022-06-24 DIAGNOSIS — E1129 Type 2 diabetes mellitus with other diabetic kidney complication: Secondary | ICD-10-CM | POA: Diagnosis present

## 2022-06-24 DIAGNOSIS — I129 Hypertensive chronic kidney disease with stage 1 through stage 4 chronic kidney disease, or unspecified chronic kidney disease: Secondary | ICD-10-CM | POA: Diagnosis not present

## 2022-06-24 DIAGNOSIS — E1122 Type 2 diabetes mellitus with diabetic chronic kidney disease: Secondary | ICD-10-CM | POA: Insufficient documentation

## 2022-06-24 DIAGNOSIS — N1831 Chronic kidney disease, stage 3a: Secondary | ICD-10-CM

## 2022-06-24 DIAGNOSIS — Z7984 Long term (current) use of oral hypoglycemic drugs: Secondary | ICD-10-CM | POA: Diagnosis not present

## 2022-06-24 DIAGNOSIS — N1832 Chronic kidney disease, stage 3b: Secondary | ICD-10-CM | POA: Insufficient documentation

## 2022-06-24 DIAGNOSIS — Z01818 Encounter for other preprocedural examination: Secondary | ICD-10-CM

## 2022-06-24 DIAGNOSIS — N179 Acute kidney failure, unspecified: Secondary | ICD-10-CM | POA: Diagnosis not present

## 2022-06-24 DIAGNOSIS — I10 Essential (primary) hypertension: Secondary | ICD-10-CM

## 2022-06-24 DIAGNOSIS — E785 Hyperlipidemia, unspecified: Secondary | ICD-10-CM | POA: Diagnosis present

## 2022-06-24 LAB — POCT I-STAT, CHEM 8
BUN: 29 mg/dL — ABNORMAL HIGH (ref 8–23)
Calcium, Ion: 1.27 mmol/L (ref 1.15–1.40)
Chloride: 112 mmol/L — ABNORMAL HIGH (ref 98–111)
Creatinine, Ser: 2 mg/dL — ABNORMAL HIGH (ref 0.44–1.00)
Glucose, Bld: 86 mg/dL (ref 70–99)
HCT: 34 % — ABNORMAL LOW (ref 36.0–46.0)
Hemoglobin: 11.6 g/dL — ABNORMAL LOW (ref 12.0–15.0)
Potassium: 5.8 mmol/L — ABNORMAL HIGH (ref 3.5–5.1)
Sodium: 139 mmol/L (ref 135–145)
TCO2: 19 mmol/L — ABNORMAL LOW (ref 22–32)

## 2022-06-24 LAB — BASIC METABOLIC PANEL
Anion gap: 4 — ABNORMAL LOW (ref 5–15)
BUN: 30 mg/dL — ABNORMAL HIGH (ref 8–23)
CO2: 21 mmol/L — ABNORMAL LOW (ref 22–32)
Calcium: 8.9 mg/dL (ref 8.9–10.3)
Chloride: 114 mmol/L — ABNORMAL HIGH (ref 98–111)
Creatinine, Ser: 1.72 mg/dL — ABNORMAL HIGH (ref 0.44–1.00)
GFR, Estimated: 31 mL/min — ABNORMAL LOW (ref >60)
Glucose, Bld: 105 mg/dL — ABNORMAL HIGH (ref 70–99)
Potassium: 5.3 mmol/L — ABNORMAL HIGH (ref 3.5–5.1)
Sodium: 139 mmol/L (ref 135–145)

## 2022-06-24 LAB — GLUCOSE, CAPILLARY
Glucose-Capillary: 78 mg/dL (ref 70–99)
Glucose-Capillary: 93 mg/dL (ref 70–99)
Glucose-Capillary: 97 mg/dL (ref 70–99)
Glucose-Capillary: 128 mg/dL — ABNORMAL HIGH (ref 70–99)

## 2022-06-24 LAB — ABO/RH: ABO/RH(D): O POS

## 2022-06-24 LAB — POTASSIUM: Potassium: 5.9 mmol/L — ABNORMAL HIGH (ref 3.5–5.1)

## 2022-06-24 SURGERY — ARTHROPLASTY, KNEE, TOTAL
Anesthesia: Choice | Site: Knee | Laterality: Right

## 2022-06-24 MED ORDER — SODIUM CHLORIDE 0.9 % IV BOLUS
1000.0000 mL | Freq: Once | INTRAVENOUS | Status: AC
  Administered 2022-06-24: 1000 mL via INTRAVENOUS

## 2022-06-24 MED ORDER — SODIUM CHLORIDE 0.9 % IV SOLN: INTRAVENOUS | Status: DC

## 2022-06-24 MED ORDER — HYDRALAZINE HCL 20 MG/ML IJ SOLN: 5.0000 mg | INTRAMUSCULAR | Status: DC | PRN

## 2022-06-24 MED ORDER — ACETAMINOPHEN 500 MG PO TABS: 1000.0000 mg | ORAL_TABLET | Freq: Four times a day (QID) | ORAL | Status: DC

## 2022-06-24 MED ORDER — FAMOTIDINE 20 MG PO TABS
ORAL_TABLET | ORAL | Status: AC
  Administered 2022-06-24: 20 mg via ORAL
  Filled 2022-06-24: qty 1

## 2022-06-24 MED ORDER — DEXTROSE 50 % IV SOLN
INTRAVENOUS | Status: AC
  Administered 2022-06-24: 50 mL via INTRAVENOUS
  Filled 2022-06-24: qty 50

## 2022-06-24 MED ORDER — CALCIUM GLUCONATE-NACL 1-0.675 GM/50ML-% IV SOLN
1.0000 g | Freq: Once | INTRAVENOUS | Status: AC
  Administered 2022-06-24: 1000 mg via INTRAVENOUS
  Filled 2022-06-24: qty 50

## 2022-06-24 MED ORDER — MIDAZOLAM HCL 2 MG/2ML IJ SOLN
INTRAMUSCULAR | Status: AC
  Filled 2022-06-24: qty 2

## 2022-06-24 MED ORDER — METOCLOPRAMIDE HCL 10 MG PO TABS: 5.0000 mg | ORAL_TABLET | Freq: Three times a day (TID) | ORAL | Status: DC | PRN

## 2022-06-24 MED ORDER — FENTANYL CITRATE (PF) 100 MCG/2ML IJ SOLN
INTRAMUSCULAR | Status: AC
  Filled 2022-06-24: qty 2

## 2022-06-24 MED ORDER — ACETAMINOPHEN 325 MG PO TABS
650.0000 mg | ORAL_TABLET | Freq: Four times a day (QID) | ORAL | Status: DC | PRN
  Administered 2022-06-24: 650 mg via ORAL

## 2022-06-24 MED ORDER — SODIUM CHLORIDE 0.9 % IV BOLUS: 1000.0000 mL | Freq: Once | INTRAVENOUS | Status: DC

## 2022-06-24 MED ORDER — INSULIN ASPART 100 UNIT/ML IV SOLN
8.0000 [IU] | Freq: Once | INTRAVENOUS | Status: AC
  Administered 2022-06-24: 8 [IU] via INTRAVENOUS
  Filled 2022-06-24: qty 0.08

## 2022-06-24 MED ORDER — CHLORHEXIDINE GLUCONATE 0.12 % MT SOLN: 15.0000 mL | Freq: Once | OROMUCOSAL | Status: AC

## 2022-06-24 MED ORDER — FLEET ENEMA 7-19 GM/118ML RE ENEM: 1.0000 | ENEMA | Freq: Once | RECTAL | Status: DC | PRN

## 2022-06-24 MED ORDER — SODIUM ZIRCONIUM CYCLOSILICATE 5 G PO PACK
5.0000 g | PACK | Freq: Once | ORAL | Status: AC
  Administered 2022-06-24: 5 g via ORAL
  Filled 2022-06-24: qty 1

## 2022-06-24 MED ORDER — INSULIN ASPART 100 UNIT/ML IV SOLN
8.0000 [IU] | Freq: Once | INTRAVENOUS | Status: DC
  Filled 2022-06-24: qty 0.08

## 2022-06-24 MED ORDER — MAGNESIUM HYDROXIDE 400 MG/5ML PO SUSP: 30.0000 mL | Freq: Every day | ORAL | Status: DC | PRN

## 2022-06-24 MED ORDER — ONDANSETRON HCL 4 MG/2ML IJ SOLN: 4.0000 mg | Freq: Four times a day (QID) | INTRAMUSCULAR | Status: DC | PRN

## 2022-06-24 MED ORDER — LISINOPRIL-HYDROCHLOROTHIAZIDE 20-12.5 MG PO TABS: 1.0000 | ORAL_TABLET | Freq: Every day | ORAL | Status: DC

## 2022-06-24 MED ORDER — FAMOTIDINE 20 MG PO TABS: 20.0000 mg | ORAL_TABLET | Freq: Once | ORAL | Status: AC

## 2022-06-24 MED ORDER — PROPOFOL 10 MG/ML IV BOLUS
INTRAVENOUS | Status: AC
  Filled 2022-06-24: qty 20

## 2022-06-24 MED ORDER — APIXABAN 2.5 MG PO TABS: 2.5000 mg | ORAL_TABLET | Freq: Two times a day (BID) | ORAL | Status: DC

## 2022-06-24 MED ORDER — BUPIVACAINE LIPOSOME 1.3 % IJ SUSP
INTRAMUSCULAR | Status: AC
  Filled 2022-06-24: qty 20

## 2022-06-24 MED ORDER — PIOGLITAZONE HCL 30 MG PO TABS: 45.0000 mg | ORAL_TABLET | Freq: Every day | ORAL | Status: DC

## 2022-06-24 MED ORDER — DEXTROSE 50 % IV SOLN: 50.0000 mL | Freq: Once | INTRAVENOUS | Status: DC

## 2022-06-24 MED ORDER — ACETAMINOPHEN 325 MG PO TABS
ORAL_TABLET | ORAL | Status: AC
  Filled 2022-06-24: qty 2

## 2022-06-24 MED ORDER — DEXTROSE 50 % IV SOLN: 50.0000 mL | Freq: Once | INTRAVENOUS | Status: AC

## 2022-06-24 MED ORDER — CEFAZOLIN SODIUM-DEXTROSE 2-4 GM/100ML-% IV SOLN: 2.0000 g | Freq: Four times a day (QID) | INTRAVENOUS | Status: DC

## 2022-06-24 MED ORDER — KETOROLAC TROMETHAMINE 15 MG/ML IJ SOLN: 15.0000 mg | Freq: Once | INTRAMUSCULAR | Status: DC

## 2022-06-24 MED ORDER — PRAVASTATIN SODIUM 20 MG PO TABS: 20.0000 mg | ORAL_TABLET | Freq: Every day | ORAL | Status: DC

## 2022-06-24 MED ORDER — TRIAMCINOLONE ACETONIDE 40 MG/ML IJ SUSP
INTRAMUSCULAR | Status: AC
  Filled 2022-06-24: qty 2

## 2022-06-24 MED ORDER — CEFAZOLIN SODIUM-DEXTROSE 2-4 GM/100ML-% IV SOLN
INTRAVENOUS | Status: AC
  Filled 2022-06-24: qty 100

## 2022-06-24 MED ORDER — ENOXAPARIN SODIUM 30 MG/0.3ML IJ SOSY
30.0000 mg | PREFILLED_SYRINGE | INTRAMUSCULAR | Status: DC
  Administered 2022-06-25: 30 mg via SUBCUTANEOUS
  Filled 2022-06-24: qty 0.3

## 2022-06-24 MED ORDER — BUPIVACAINE HCL (PF) 0.5 % IJ SOLN
INTRAMUSCULAR | Status: AC
  Filled 2022-06-24: qty 30

## 2022-06-24 MED ORDER — DIPHENHYDRAMINE HCL 12.5 MG/5ML PO ELIX: 12.5000 mg | ORAL_SOLUTION | ORAL | Status: DC | PRN

## 2022-06-24 MED ORDER — HYDROMORPHONE HCL 1 MG/ML IJ SOLN: 0.2500 mg | INTRAMUSCULAR | Status: DC | PRN

## 2022-06-24 MED ORDER — MULTI-VITAMIN/MINERALS PO TABS: 1.0000 | ORAL_TABLET | Freq: Every day | ORAL | Status: DC

## 2022-06-24 MED ORDER — ORAL CARE MOUTH RINSE: 15.0000 mL | Freq: Once | OROMUCOSAL | Status: AC

## 2022-06-24 MED ORDER — INSULIN ASPART 100 UNIT/ML IJ SOLN: 0.0000 [IU] | Freq: Three times a day (TID) | INTRAMUSCULAR | Status: DC

## 2022-06-24 MED ORDER — CALCIUM GLUCONATE-NACL 1-0.675 GM/50ML-% IV SOLN
1.0000 g | Freq: Once | INTRAVENOUS | Status: DC
  Filled 2022-06-24: qty 50

## 2022-06-24 MED ORDER — BISACODYL 10 MG RE SUPP: 10.0000 mg | Freq: Every day | RECTAL | Status: DC | PRN

## 2022-06-24 MED ORDER — DOCUSATE SODIUM 100 MG PO CAPS: 100.0000 mg | ORAL_CAPSULE | Freq: Two times a day (BID) | ORAL | Status: DC

## 2022-06-24 MED ORDER — TRANEXAMIC ACID 1000 MG/10ML IV SOLN
INTRAVENOUS | Status: AC
  Filled 2022-06-24: qty 10

## 2022-06-24 MED ORDER — INSULIN ASPART 100 UNIT/ML IJ SOLN: 0.0000 [IU] | Freq: Every day | INTRAMUSCULAR | Status: DC

## 2022-06-24 MED ORDER — PROPOFOL 1000 MG/100ML IV EMUL
INTRAVENOUS | Status: AC
  Filled 2022-06-24: qty 100

## 2022-06-24 MED ORDER — CHLORHEXIDINE GLUCONATE 0.12 % MT SOLN
OROMUCOSAL | Status: AC
  Administered 2022-06-24: 15 mL via OROMUCOSAL
  Filled 2022-06-24: qty 15

## 2022-06-24 MED ORDER — METOCLOPRAMIDE HCL 5 MG/ML IJ SOLN: 5.0000 mg | Freq: Three times a day (TID) | INTRAMUSCULAR | Status: DC | PRN

## 2022-06-24 MED ORDER — KETOROLAC TROMETHAMINE 15 MG/ML IJ SOLN: 7.5000 mg | Freq: Four times a day (QID) | INTRAMUSCULAR | Status: DC

## 2022-06-24 MED ORDER — PRAVASTATIN SODIUM 20 MG PO TABS
20.0000 mg | ORAL_TABLET | Freq: Every day | ORAL | Status: DC
  Administered 2022-06-24: 20 mg via ORAL
  Filled 2022-06-24: qty 1

## 2022-06-24 MED ORDER — SODIUM ZIRCONIUM CYCLOSILICATE 10 G PO PACK
10.0000 g | PACK | Freq: Once | ORAL | Status: AC
  Administered 2022-06-24: 10 g via ORAL
  Filled 2022-06-24: qty 1

## 2022-06-24 MED ORDER — SODIUM ZIRCONIUM CYCLOSILICATE 10 G PO PACK
10.0000 g | PACK | Freq: Once | ORAL | Status: DC
  Filled 2022-06-24: qty 1

## 2022-06-24 MED ORDER — METFORMIN HCL 500 MG PO TABS: 500.0000 mg | ORAL_TABLET | Freq: Every day | ORAL | Status: DC

## 2022-06-24 MED ORDER — ONDANSETRON HCL 4 MG/2ML IJ SOLN: 4.0000 mg | Freq: Three times a day (TID) | INTRAMUSCULAR | Status: DC | PRN

## 2022-06-24 MED ORDER — CEFAZOLIN SODIUM-DEXTROSE 2-4 GM/100ML-% IV SOLN: 2.0000 g | INTRAVENOUS | Status: DC

## 2022-06-24 MED ORDER — OXYCODONE HCL 5 MG PO TABS: 5.0000 mg | ORAL_TABLET | ORAL | Status: DC | PRN

## 2022-06-24 MED ORDER — SODIUM CHLORIDE FLUSH 0.9 % IV SOLN
INTRAVENOUS | Status: AC
  Filled 2022-06-24: qty 40

## 2022-06-24 MED ORDER — ACETAMINOPHEN 325 MG PO TABS: 325.0000 mg | ORAL_TABLET | Freq: Four times a day (QID) | ORAL | Status: DC | PRN

## 2022-06-24 MED ORDER — ENOXAPARIN SODIUM 40 MG/0.4ML IJ SOSY: 40.0000 mg | PREFILLED_SYRINGE | INTRAMUSCULAR | Status: DC

## 2022-06-24 MED ORDER — ONDANSETRON HCL 4 MG PO TABS: 4.0000 mg | ORAL_TABLET | Freq: Four times a day (QID) | ORAL | Status: DC | PRN

## 2022-06-24 SURGICAL SUPPLY — 52 items
BLADE SAW SAG 25X90X1.19 (BLADE) ×1 IMPLANT
BLADE SURG SZ20 CARB STEEL (BLADE) ×1 IMPLANT
BNDG ELASTIC 6X5.8 VLCR NS LF (GAUZE/BANDAGES/DRESSINGS) ×1 IMPLANT
CEMENT BONE R 1X40 (Cement) ×2 IMPLANT
CEMENT VACUUM MIXING SYSTEM (MISCELLANEOUS) ×1 IMPLANT
CHLORAPREP W/TINT 26 (MISCELLANEOUS) ×1 IMPLANT
COOLER POLAR GLACIER W/PUMP (MISCELLANEOUS) ×1 IMPLANT
COVER MAYO STAND REUSABLE (DRAPES) ×1 IMPLANT
CUFF TOURN SGL QUICK 24 (TOURNIQUET CUFF)
CUFF TOURN SGL QUICK 34 (TOURNIQUET CUFF)
CUFF TRNQT CYL 24X4X16.5-23 (TOURNIQUET CUFF) IMPLANT
CUFF TRNQT CYL 34X4.125X (TOURNIQUET CUFF) IMPLANT
DRAPE 3/4 80X56 (DRAPES) ×1 IMPLANT
DRAPE IMP U-DRAPE 54X76 (DRAPES) ×1 IMPLANT
DRAPE U-SHAPE 47X51 STRL (DRAPES) ×1 IMPLANT
DRSG MEPILEX SACRM 8.7X9.8 (GAUZE/BANDAGES/DRESSINGS) IMPLANT
DRSG OPSITE POSTOP 4X10 (GAUZE/BANDAGES/DRESSINGS) ×1 IMPLANT
DRSG OPSITE POSTOP 4X8 (GAUZE/BANDAGES/DRESSINGS) ×1 IMPLANT
ELECT REM PT RETURN 9FT ADLT (ELECTROSURGICAL) ×1
ELECTRODE REM PT RTRN 9FT ADLT (ELECTROSURGICAL) ×1 IMPLANT
GAUZE XEROFORM 1X8 LF (GAUZE/BANDAGES/DRESSINGS) ×1 IMPLANT
GLOVE BIO SURGEON STRL SZ7.5 (GLOVE) ×4 IMPLANT
GLOVE BIO SURGEON STRL SZ8 (GLOVE) ×4 IMPLANT
GLOVE BIOGEL PI IND STRL 8 (GLOVE) ×1 IMPLANT
GLOVE SURG UNDER LTX SZ8 (GLOVE) ×1 IMPLANT
GOWN STRL REUS W/ TWL LRG LVL3 (GOWN DISPOSABLE) ×1 IMPLANT
GOWN STRL REUS W/ TWL XL LVL3 (GOWN DISPOSABLE) ×1 IMPLANT
GOWN STRL REUS W/TWL LRG LVL3 (GOWN DISPOSABLE) ×1
GOWN STRL REUS W/TWL XL LVL3 (GOWN DISPOSABLE) ×1
HOOD PEEL AWAY FLYTE STAYCOOL (MISCELLANEOUS) ×3 IMPLANT
IV NS IRRIG 3000ML ARTHROMATIC (IV SOLUTION) ×1 IMPLANT
KIT TURNOVER KIT A (KITS) ×1 IMPLANT
MANIFOLD NEPTUNE II (INSTRUMENTS) ×1 IMPLANT
NEEDLE SPNL 20GX3.5 QUINCKE YW (NEEDLE) ×1 IMPLANT
NS IRRIG 1000ML POUR BTL (IV SOLUTION) ×1 IMPLANT
PACK TOTAL KNEE (MISCELLANEOUS) ×1 IMPLANT
PAD WRAPON POLAR KNEE (MISCELLANEOUS) ×1 IMPLANT
PENCIL SMOKE EVACUATOR (MISCELLANEOUS) ×1 IMPLANT
PULSAVAC PLUS IRRIG FAN TIP (DISPOSABLE) ×1
STAPLER SKIN PROX 35W (STAPLE) ×1 IMPLANT
SUCTION FRAZIER HANDLE 10FR (MISCELLANEOUS) ×1
SUCTION TUBE FRAZIER 10FR DISP (MISCELLANEOUS) ×1 IMPLANT
SUT VIC AB 0 CT1 36 (SUTURE) ×3 IMPLANT
SUT VIC AB 2-0 CT1 27 (SUTURE) ×3
SUT VIC AB 2-0 CT1 TAPERPNT 27 (SUTURE) ×3 IMPLANT
SYR 10ML LL (SYRINGE) ×1 IMPLANT
SYR 20ML LL LF (SYRINGE) ×1 IMPLANT
SYR 30ML LL (SYRINGE) IMPLANT
TIP FAN IRRIG PULSAVAC PLUS (DISPOSABLE) ×1 IMPLANT
TRAP FLUID SMOKE EVACUATOR (MISCELLANEOUS) ×2 IMPLANT
WATER STERILE IRR 500ML POUR (IV SOLUTION) ×1 IMPLANT
WRAPON POLAR PAD KNEE (MISCELLANEOUS) ×1

## 2022-06-24 NOTE — Assessment & Plan Note (Signed)
-  Blood pressure 126/61 - Hold Prinzide -IV hydralazine as needed

## 2022-06-24 NOTE — TOC Progression Note (Signed)
Transition of Care [facility]) - Progression Note    Patient Details  Name: Veronica Barajas MRN: [id number] Date of Birth: [birth year]  Transition of Care [facility]) CM/SW [place], RN Phone Number: 06/24/2022, 4:02 PM  Clinical Narrative:     The patient is ambulating with a cane or walker at home, and frequently will use a wheelchair to get around.   She is set up with [facility] for Home helth      Expected Discharge Plan and Services                                                 Social Determinants of Health (SDOH) Interventions    Readmission Risk Interventions     No data to display

## 2022-06-24 NOTE — Assessment & Plan Note (Signed)
Likely due to dehydration secondary to diarrhea. -Hold Prinzide -IV fluid as above -Avoid using renal toxic medications

## 2022-06-24 NOTE — Progress Notes (Signed)
Patient's surgery was cancelled due to elevated potassium.  Patient will be admitted for treatment.

## 2022-06-24 NOTE — Plan of Care (Signed)
  Problem: Coping: Goal: Ability to adjust to condition or change in health will improve Outcome: Progressing   Problem: Fluid Volume: Goal: Ability to maintain a balanced intake and output will improve Outcome: Progressing   Problem: Nutritional: Goal: Maintenance of adequate nutrition will improve Outcome: Progressing   Problem: Clinical Measurements: Goal: Diagnostic test results will improve Outcome: Progressing   Problem: Nutrition: Goal: Adequate nutrition will be maintained Outcome: Progressing   Problem: Elimination: Goal: Will not experience complications related to urinary retention Outcome: Progressing   Problem: Safety: Goal: Ability to remain free from injury will improve Outcome: Progressing

## 2022-06-24 NOTE — Assessment & Plan Note (Signed)
Potassium 5.9, no T wave peaking EKG.  This is less likely due to worsening renal function secondary to dehydration.  Patient has a diarrhea.  -Placed on telemetry bed for observation -Patient was given D50, 80 units of NovoLog, 1 g calcium gluconate, 10 g of leukoma. -IV fluid: 1 L normal saline -Repeat BMP at 13: 00

## 2022-06-24 NOTE — Assessment & Plan Note (Addendum)
-   Switch lovastatin to pravastatin in hospital

## 2022-06-24 NOTE — H&P (Addendum)
History of Present Illness: Veronica Barajas is a 71 y.o. who presents today for his physical. She is to undergo a right total knee arthroplasty on 06/24/2022. Patient was last seen in the clinic on 04/28/2022. There is been no change in her condition since that time. Patient states that her pain is increased to the point is significant interfering with her activities of daily living and wishes to proceed with surgery.  Patient presented for bilateral knee pain, right more symptomatic than left. Initially, the patient saw Dr. Rudene Christians in January, 2019. He performed bilateral steroid injections which provided moderate temporary relief of both knees. These injections were repeated on several occasions. In addition, she underwent viscosupplementation injections in 2021 which also provided temporary partial relief of her symptoms. Most recently, she saw Rachelle Hora, PA-C, about 6 weeks ago complaining of worsening pain and deformity of both knees, right more symptomatic than left. Therefore, the patient has been referred to me to discuss further treatment options. She reports 4/10 pain in the knee on today's visit. The pain is located along the medial aspect of the knee. The pain is described as aching, dull, shooting, stabbing, and throbbing. The symptoms are aggravated with normal daily activities, with sleeping, using stairs, at higher levels of activity, rising from a chair, walking, standing, standing pivot, and activity in general. She also describes crepitance with range of motion, as well as a feeling as though her knee wants to give way on her. She has associated swelling and deformity. She has tried acetaminophen, over-the-counter medications, anti-inflammatories, steroid injections, and viscosupplementation injections with limited benefit. The patient is ambulating with a cane or walker at home, and frequently will use a wheelchair to get around.   Past Medical History: Allergic state  Arthritis  Diabetes  mellitus type 2, uncomplicated (CMS-HCC)  History of chest pain  History of hemorrhoids  HSV-1 (herpes simplex virus 1) infection  Hyperlipidemia  IBS (irritable bowel syndrome)  Psoriasis   Past Surgical History: COLONOSCOPY 01/14/2011  Bladder sling  CHOLECYSTECTOMY OPEN  HYSTERECTOMY with bilateral oophorectomy 2011   Past Family History: Diabetes Mother  Heart disease Mother  Stroke Mother  Diabetes Maternal Aunt  Alzheimer's disease Father   Medications: blood glucose diagnostic test strip 1 each (1 strip total) once daily 100 each 5  blood-glucose meter (ONETOUCH VERIO IQ METER) Misc as directed 1 each 0  cetirizine (ZYRTEC) 10 MG tablet Take 1 tablet (10 mg total) by mouth once daily as needed for Allergies 90 tablet 0  lancets (ONETOUCH DELICA PLUS LANCET) once daily Use as instructed. 100 each 3  lisinopriL-hydroCHLOROthiazide (ZESTORETIC) 20-12.5 mg tablet Take 1 tablet by mouth once daily 90 tablet 3  lovastatin (MEVACOR) 20 MG tablet Take 1 tablet (20 mg total) by mouth daily with dinner 90 tablet 3  meloxicam (MOBIC) 7.5 MG tablet Take 7.5 mg by mouth 2 (two) times daily as needed for Pain  metFORMIN (GLUCOPHAGE) 500 MG tablet Take 1 tablet (500 mg total) by mouth daily with breakfast 90 tablet 3  multivitamin with minerals tablet Take 1 tablet by mouth once daily  pioglitazone (ACTOS) 45 MG tablet Take 1 tablet (45 mg total) by mouth once daily 90 tablet 3   Allergies: Tetracycline Other (Nausea And Vomiting, GI upset)  Mucinex [Guaifenesin] Nausea and Vomiting  Tetracyclines Nausea And Vomiting   Review of Systems: A comprehensive 14 point ROS was performed, reviewed, and the pertinent orthopaedic findings are documented in the HPI.  Physical Exam: BP 110/60 (BP  Location: Left upper arm, Patient Position: Sitting, BP Cuff Size: Adult)  Ht 157.5 cm (5\' 2" )  Wt 76.8 kg (169 lb 6.4 oz)  BMI 30.98 kg/m   General: Well-developed well-nourished female seen in  no acute distress.   HEENT: Atraumatic,normocephalic. Pupils are equal and reactive to light. Oropharynx is clear with moist mucosa  Lungs: Clear to auscultation bilaterally   Cardiovascular: Regular rate and rhythm. Normal S1, S2. No murmurs. No appreciable gallops or rubs. Peripheral pulses are palpable.  Abdomen: Soft, non-tender, nondistended. Bowel sounds present  Right knee exam: GAIT: Patient's gait is not assessed on today's visit as she presents in a wheelchair. ALIGNMENT: significant varus > 10 degrees SKIN: unremarkable SWELLING: mild EFFUSION: small WARMTH: no warmth TENDERNESS: moderate over the medial joint line, mild along lateral joint line ROM: 10 to 90 degrees with pain at the extremes of flexion greater than extension McMURRAY'S: positive PATELLOFEMORAL: normal tracking with no peri-patellar tenderness and negative apprehension sign CREPITUS: Mild patellofemoral crepitance LACHMAN'S: negative PIVOT SHIFT: negative ANTERIOR DRAWER: negative POSTERIOR DRAWER: negative VARUS/VALGUS: positive pseudolaxity to varus stressing  Neurological: The patient is alert and oriented Sensation to light touch appears to be intact and within normal limits Gross motor strength appeared to be equal to 5/5  Vascular: Peripheral pulses felt to be palpable. Capillary refill appears to be intact and within normal limits  X-ray: Films of the right knee taken on 04/28/2022 demonstrated severe degenerative changes, primarily involving the medial compartment with 100% medial joint space narrowing. In fact, there appears to be at least 5 mm of erosion into the medial tibial plateau. Overall alignment is significant varus. No fractures, lytic lesions, or abnormal calcifications are noted.   Impression: 1. Degenerative arthrosis right knee  Plan:  The treatment options were discussed with the patient. In addition, patient educational materials were provided regarding the diagnosis  and treatment options. The patient is quite frustrated by her symptoms and functional limitations, and is ready to consider more aggressive treatment options. Therefore, I have recommended a surgical procedure, specifically a right total knee arthroplasty. The procedure was discussed with the patient, as were the potential risks (including bleeding, infection, nerve and/or blood vessel injury, persistent or recurrent pain, loosening and/or failure of the components, dislocation, need for further surgery, blood clots, strokes, heart attacks and/or arhythmias, pneumonia, etc.) and benefits. The patient states his understanding and wishes to proceed. All of the patient's questions and concerns were answered. She can call any time with further concerns. She will follow up post-surgery, routine.     H&P reviewed and patient re-examined. No changes.  Addendum: The patient's preoperative potassium was elevated at 5.8.  A repeat potassium check was 5.9.  Therefore, the surgical procedure will be canceled and she will be admitted to the hospitalist service for management of her hyperkalemia.  Of note, the patient's BUN was elevated this morning at 29 and her creatinine was elevated at 2.0.  I have spoken with Dr. Blaine Hamper who will admit the patient.

## 2022-06-24 NOTE — Assessment & Plan Note (Signed)
-  need re-schedule her surgery per Dr. Roland Rack -As needed Tylenol -Lidoderm patch

## 2022-06-24 NOTE — Assessment & Plan Note (Signed)
-  check C diff -IVF as above

## 2022-06-24 NOTE — H&P (Signed)
History and Physical    Veronica Barajas JFH:[id number] DOB: 03/16/1951 DOA: 06/24/2022  Referring MD/NP/PA:   PCP: Dorothey Baseman, MD   Patient coming from:  The patient is coming from home.     Chief Complaint: hyperkalemia  HPI: Veronica Barajas is a 71 y.o. female with medical history significant of degenerative arthrosis right knee, hypertension, hyperlipidemia, diabetes mellitus, CKD stage III, who presents with hyperkalemia.  Patient has severe degenerative arthrosis of right knee.  She is scheduled for right knee replacement surgery today by Dr. Joice Lofts.  Presurgical lab showed potassium 5.9 and worsening renal function, surgery is canceled. Patient denies chest pain, cough, shortness of breath.  No fever or chills.  Patient states that she has has 6 episodes of loose stool bowel movement since yesterday.  Denies nausea, vomiting or abdominal pain.  No symptoms of UTI.  She has right knee pain.  Data reviewed independently and ED Course: pt was found to have potassium 5.9, hemoglobin stable, worsening renal function with creatinine 2.0, BUN 29 (recent baseline creatinine 1.3 06/07/2022), temperature normal, blood pressure 126/61, heart rate 61, RR 16, oxygen saturation 98% on room air.   EKG: I have personally reviewed.  Sinus rhythm, QTc 414, low voltage.  In lead III/aVF.  No ischemic change.  No T wave peaking.    Review of Systems:   General: no fevers, chills, no body weight gain, fatigue HEENT: no blurry vision, hearing changes or sore throat Respiratory: no dyspnea, coughing, wheezing CV: no chest pain, no palpitations GI: no nausea, vomiting, abdominal pain, diarrhea, constipation GU: no dysuria, burning on urination, increased urinary frequency, hematuria  Ext: no leg edema Neuro: no unilateral weakness, numbness, or tingling, no vision change or hearing loss Skin: no rash, no skin tear. MSK: No muscle spasm, no deformity, no limitation of range of movement in spin.  Has right knee pain Heme: No easy bruising.  Travel history: No recent long distant travel.   Allergy:  Allergies  Allergen Reactions   Tetracyclines & Related Nausea And Vomiting    Past Medical History:  Diagnosis Date   Arthritis    Diabetes mellitus without complication (HCC)    Dyspnea    Headache    HLD (hyperlipidemia)    Hypertension     Past Surgical History:  Procedure Laterality Date   ABDOMINAL HYSTERECTOMY     BLADDER SUSPENSION     CHOLECYSTECTOMY     EYE SURGERY      Social History:  reports that she has never smoked. She has never used smokeless tobacco. She reports current alcohol use of about 1.0 standard drink of alcohol per week. She reports that she does not use drugs.  Family History:  Family History  Problem Relation Age of Onset   Alcoholism Mother      Prior to Admission medications   Medication Sig Start Date End Date Taking? Authorizing Provider  Multiple Vitamins-Minerals (MULTIVITAMIN WITH MINERALS) tablet Take 1 tablet by mouth daily.   Yes [provider]  lisinopril-hydrochlorothiazide (ZESTORETIC) 20-12.5 MG tablet Take 1 tablet by mouth daily.    [provider]  lovastatin (MEVACOR) 20 MG tablet Take 20 mg by mouth daily at 6 PM.    [provider]  metFORMIN (GLUCOPHAGE) 500 MG tablet Take 500 mg by mouth daily with breakfast.    [provider]  pioglitazone (ACTOS) 45 MG tablet Take 45 mg by mouth daily.    [provider]  PREBIOTIC PRODUCT PO Take  1 capsule by mouth daily.    [provider]    Physical Exam: Vitals:   06/24/22 0634 06/24/22 1200  BP: 126/61 (!) 121/42  Pulse: 61 65  Resp: 16 18  Temp: 98.6 F (37 C) 98 F (36.7 C)  TempSrc: Temporal Oral  SpO2: 98% 98%  Weight: 77.1 kg   Height: 5\' 2"  (1.575 m)    General: Not in acute distress HEENT:       Eyes: PERRL, EOMI, no scleral icterus.       ENT: No discharge from the ears and nose, no pharynx  injection, no tonsillar enlargement.        Neck: No JVD, no bruit, no mass felt. Heme: No neck lymph node enlargement. Cardiac: S1/S2, RRR, No murmurs, No gallops or rubs. Respiratory: No rales, wheezing, rhonchi or rubs. GI: Soft, nondistended, nontender, no rebound pain, no organomegaly, BS present. GU: No hematuria Ext: No pitting leg edema bilaterally. 1+DP/PT pulse bilaterally. Musculoskeletal: No joint deformities. no limitation of ROM in spin. Has right knee tenderness Skin: No rashes.  Neuro: Alert, oriented X3, cranial nerves II-XII grossly intact, moves all extremities normally.  Psych: Patient is not psychotic, no suicidal or hemocidal ideation.  Labs on Admission: I have personally reviewed following labs and imaging studies  CBC: Recent Labs  Lab 06/24/22 0646  HGB 11.6*  HCT 16.1*   Basic Metabolic Panel: Recent Labs  Lab 06/24/22 0646 06/24/22 0728  NA 139  --   K 5.8* 5.9*  CL 112*  --   GLUCOSE 86  --   BUN 29*  --   CREATININE 2.00*  --    GFR: Estimated Creatinine Clearance: 24.8 mL/min (A) (by C-G formula based on SCr of 2 mg/dL (H)). Liver Function Tests: No results for input(s): "AST", "ALT", "ALKPHOS", "BILITOT", "PROT", "ALBUMIN" in the last 168 hours. No results for input(s): "LIPASE", "AMYLASE" in the last 168 hours. No results for input(s): "AMMONIA" in the last 168 hours. Coagulation Profile: No results for input(s): "INR", "PROTIME" in the last 168 hours. Cardiac Enzymes: No results for input(s): "CKTOTAL", "CKMB", "CKMBINDEX", "TROPONINI" in the last 168 hours. BNP (last 3 results) No results for input(s): "PROBNP" in the last 8760 hours. HbA1C: No results for input(s): "HGBA1C" in the last 72 hours. CBG: Recent Labs  Lab 06/24/22 0626 06/24/22 1155  GLUCAP 78 93   Lipid Profile: No results for input(s): "CHOL", "HDL", "LDLCALC", "TRIG", "CHOLHDL", "LDLDIRECT" in the last 72 hours. Thyroid Function Tests: No results for  input(s): "TSH", "T4TOTAL", "FREET4", "T3FREE", "THYROIDAB" in the last 72 hours. Anemia Panel: No results for input(s): "VITAMINB12", "FOLATE", "FERRITIN", "TIBC", "IRON", "RETICCTPCT" in the last 72 hours. Urine analysis:    Component Value Date/Time   COLORURINE YELLOW (A) 06/12/2022 1218   APPEARANCEUR HAZY (A) 06/12/2022 1218   LABSPEC 1.015 06/12/2022 1218   PHURINE 5.0 06/12/2022 [place] 06/12/2022 1218   HGBUR NEGATIVE 06/12/2022 [place] 06/12/2022 [place] 06/12/2022 1218   PROTEINUR NEGATIVE 06/12/2022 1218   NITRITE NEGATIVE 06/12/2022 1218   LEUKOCYTESUR TRACE (A) 06/12/2022 1218   Sepsis Labs: @LABRCNTIP (procalcitonin:4,lacticidven:4) )No results found for this or any previous visit (from the past 240 hour(s)).   Radiological Exams on Admission: No results found.    Assessment/Plan Principal Problem:   Hyperkalemia Active Problems:   Acute renal failure superimposed on stage 3a chronic kidney disease (HCC)   Hypertension   HLD (hyperlipidemia)   Type  II diabetes mellitus with renal manifestations (HCC)   Right knee pain   Diarrhea   Assessment and Plan: * Hyperkalemia Potassium 5.9, no T wave peaking EKG.  This is less likely due to worsening renal function secondary to dehydration.  Patient has a diarrhea.  -Placed on telemetry bed for observation -Patient was given D50, 80 units of NovoLog, 1 g calcium gluconate, 10 g of leukoma. -IV fluid: 1 L normal saline -Repeat BMP at 13: 00  Acute renal failure superimposed on stage 3a chronic kidney disease (HCC) Likely due to dehydration secondary to diarrhea. -Hold Prinzide -IV fluid as above -Avoid using renal toxic medications  Hypertension -Blood pressure 126/61 - Hold Prinzide -IV hydralazine as needed  HLD (hyperlipidemia) - Switch lovastatin to pravastatin in hospital  Type II diabetes mellitus with renal manifestations (HCC) Recent A1c  5.7, well controlled.  Patient is taking Actos and metformin -Sliding scale insulin  Right knee pain -need re-schedule her surgery per Dr. Joice Lofts -As needed Tylenol -Lidoderm patch  Diarrhea -check C diff -IVF as above          DVT ppx: SQ Lovenox  Code Status: Full code  Family Communication: Yes, patient's daughter  by phone  Disposition Plan:  Anticipate discharge back to previous environment  Consults called:  none  Admission status and Level of care: Telemetry Medical:     for obs    Dispo: The patient is from: Home              Anticipated d/c is to: Home              Anticipated d/c date is: 1 day              Patient currently is not medically stable to d/c.    Severity of Illness:  The appropriate patient status for this patient is OBSERVATION. Observation status is judged to be reasonable and necessary in order to provide the required intensity of service to ensure the patient's safety. The patient's presenting symptoms, physical exam findings, and initial radiographic and laboratory data in the context of their medical condition is felt to place them at decreased risk for further clinical deterioration. Furthermore, it is anticipated that the patient will be medically stable for discharge from the hospital within 2 midnights of admission.        Date of Service 06/24/2022    Lorretta Harp [facility]   If 7PM-7AM, please contact night-coverage [email or web address] 06/24/2022, 12:15 PM

## 2022-06-24 NOTE — Assessment & Plan Note (Signed)
Recent A1c 5.7, well controlled.  Patient is taking Actos and metformin -Sliding scale insulin

## 2022-06-24 NOTE — Progress Notes (Signed)
Notified Dr. Roland Rack of pt having loose stools this morning as well as the elevated potassium with the istat and that we were getting a redraw.  He acknowledged.

## 2022-06-25 ENCOUNTER — Encounter: Payer: Self-pay | Admitting: Internal Medicine

## 2022-06-25 DIAGNOSIS — N1832 Chronic kidney disease, stage 3b: Secondary | ICD-10-CM

## 2022-06-25 DIAGNOSIS — N179 Acute kidney failure, unspecified: Secondary | ICD-10-CM | POA: Diagnosis not present

## 2022-06-25 DIAGNOSIS — E875 Hyperkalemia: Secondary | ICD-10-CM | POA: Diagnosis not present

## 2022-06-25 DIAGNOSIS — R197 Diarrhea, unspecified: Secondary | ICD-10-CM

## 2022-06-25 LAB — C DIFFICILE QUICK SCREEN W PCR REFLEX
C Diff antigen: NEGATIVE
C Diff interpretation: NOT DETECTED
C Diff toxin: NEGATIVE

## 2022-06-25 LAB — CBC
HCT: 30.4 % — ABNORMAL LOW (ref 36.0–46.0)
Hemoglobin: 9.5 g/dL — ABNORMAL LOW (ref 12.0–15.0)
MCH: 29.2 pg (ref 26.0–34.0)
MCHC: 31.3 g/dL (ref 30.0–36.0)
MCV: 93.5 fL (ref 80.0–100.0)
Platelets: 168 10*3/uL (ref 150–400)
RBC: 3.25 MIL/uL — ABNORMAL LOW (ref 3.87–5.11)
RDW: 15.3 % (ref 11.5–15.5)
WBC: 3.9 10*3/uL — ABNORMAL LOW (ref 4.0–10.5)
nRBC: 0 % (ref 0.0–0.2)

## 2022-06-25 LAB — BASIC METABOLIC PANEL
Anion gap: 5 (ref 5–15)
BUN: 32 mg/dL — ABNORMAL HIGH (ref 8–23)
CO2: 20 mmol/L — ABNORMAL LOW (ref 22–32)
Calcium: 8.9 mg/dL (ref 8.9–10.3)
Chloride: 115 mmol/L — ABNORMAL HIGH (ref 98–111)
Creatinine, Ser: 1.59 mg/dL — ABNORMAL HIGH (ref 0.44–1.00)
GFR, Estimated: 35 mL/min — ABNORMAL LOW (ref >60)
Glucose, Bld: 94 mg/dL (ref 70–99)
Potassium: 5.1 mmol/L (ref 3.5–5.1)
Sodium: 140 mmol/L (ref 135–145)

## 2022-06-25 LAB — GLUCOSE, CAPILLARY
Glucose-Capillary: 98 mg/dL (ref 70–99)
Glucose-Capillary: 87 mg/dL (ref 70–99)

## 2022-06-25 NOTE — Plan of Care (Signed)

## 2022-06-25 NOTE — Progress Notes (Signed)
Met with the patient to discuss DC plan and needs She lives at home and her daughter lives with her as well as son in [employer] and grand children,  She drives She has a rolling walker and rollator, cane at home No DME needed Wants to wait until she has knee surgery to get [facility] PT She had to postpone Knee surgery until a later date

## 2022-06-25 NOTE — Progress Notes (Signed)
Patient has been given verbal and written discharge instructions, acknowledges understanding, Waiting on daughter for ride home.

## 2022-06-25 NOTE — Plan of Care (Signed)
  Problem: Nutrition: Goal: Adequate nutrition will be maintained Outcome: Progressing   Problem: Coping: Goal: Level of anxiety will decrease Outcome: Progressing   Problem: Pain Managment: Goal: General experience of comfort will improve Outcome: Progressing   Problem: Coping: Goal: Level of anxiety will decrease Outcome: Progressing

## 2022-06-25 NOTE — Discharge Summary (Addendum)
Physician Discharge Summary   Patient: Veronica Barajas MRN: [id number] DOB: Aug 31, 1951  Admit date:     06/24/2022  Discharge date: 06/25/22  Discharge Physician: Jennye Boroughs   PCP: Juluis Pitch, MD   Recommendations at discharge:    Follow up with PCP in 1 week  Discharge Diagnoses: Principal Problem:   Hyperkalemia Active Problems:   Acute renal failure superimposed on stage 3b chronic kidney disease ([place])   Hypertension   HLD (hyperlipidemia)   Type II diabetes mellitus with renal manifestations ([place])   Right knee pain   Diarrhea  Resolved Problems:   * No resolved hospital problems. [facility] Course:  Veronica Barajas is a 71 year old woman with medical history significant for right knee degenerative arthrosis, hypertension, hyperlipidemia, type 2 diabetes mellitus, CKD stage IIIb, who presented to the hospital for elective right knee arthroplasty.  However, preoperative blood work revealed elevated potassium of 5.8.  Planned procedure was canceled and patient was referred to the hospitalist service for admission.  She reported diarrhea a few days prior to admission and she attributed it to anxiety related to impending surgery.  She was found to have AKI complicated by hyperkalemia.  She was on lisinopril/HCTZ prior to admission and this was held.  She was treated with IV fluids and Lokelma.  Hyperkalemia has resolved and AKI has improved.  Diarrhea has also resolved.  She feels much better.  She is deemed stable for discharge to home today. Outpatient follow-up with PCP for repeat BMP was recommended.  Decision should also be made regarding restarting lisinopril-HCTZ for hypertension in the near future.       Consultants: None Procedures performed: None  Disposition: Home Diet recommendation:  Discharge Diet Orders (From admission, onward)     Start     Ordered   06/25/22 0000  Diet - low sodium heart healthy        06/25/22 1113   06/25/22 0000  Diet Carb  Modified        06/25/22 1113           Cardiac and Carb modified diet DISCHARGE MEDICATION: Allergies as of 06/25/2022       Reactions   Tetracyclines & Related Nausea And Vomiting   Guaifenesin Nausea Only, Other (See Comments)        Medication List     STOP taking these medications    lisinopril-hydrochlorothiazide 20-12.5 MG tablet Commonly known as: ZESTORETIC   meloxicam 7.5 MG tablet Commonly known as: MOBIC   metFORMIN 500 MG tablet Commonly known as: GLUCOPHAGE       TAKE these medications    cetirizine 10 MG tablet Commonly known as: ZYRTEC Take 10 mg by mouth daily.   lovastatin 20 MG tablet Commonly known as: MEVACOR Take 20 mg by mouth daily at 6 PM.   multivitamin with minerals tablet Take 1 tablet by mouth daily.   pioglitazone 45 MG tablet Commonly known as: ACTOS Take 45 mg by mouth daily.   PREBIOTIC PRODUCT PO Take 1 capsule by mouth daily.        Follow-up Information     Juluis Pitch, MD Follow up.   Specialty: Family Medicine Contact information: [place] [address] [phone]                Discharge Exam: Danley Danker Weights   06/24/22 [id number]  Weight: 77.1 kg   GEN: NAD SKIN: Warm and dry EYES: EOMI ENT: MMM CV: RRR PULM:  CTA B ABD: soft, ND, NT, +BS CNS: AAO x 3, non focal EXT: No edema or tenderness MSK: Mild right knee tenderness   Condition at discharge: good  The results of significant diagnostics from this hospitalization (including imaging, microbiology, ancillary and laboratory) are listed below for reference.   Imaging Studies: No results found.  Microbiology: Results for orders placed or performed during the hospital encounter of 06/24/22  C Difficile Quick Screen w PCR reflex     Status: None   Collection Time: 06/25/22  4:16 AM   Specimen: STOOL  Result Value Ref Range Status   C Diff antigen NEGATIVE NEGATIVE Final   C Diff toxin NEGATIVE NEGATIVE Final    C Diff interpretation No C. difficile detected.  Final    Comment: Performed at [facility], [address] [place], [place] [address]    Labs: CBC: Recent Labs  Lab 06/24/22 0646 06/25/22 0527  WBC  --  3.9*  HGB 11.6* 9.5*  HCT 34.0* 30.4*  MCV  --  93.5  PLT  --  401   Basic Metabolic Panel: Recent Labs  Lab 06/24/22 0646 06/24/22 0728 06/24/22 1251 06/25/22 0527  NA 139  --  139 140  K 5.8* 5.9* 5.3* 5.1  CL 112*  --  114* 115*  CO2  --   --  21* 20*  GLUCOSE 86  --  105* 94  BUN 29*  --  30* 32*  CREATININE 2.00*  --  1.72* 1.59*  CALCIUM  --   --  8.9 8.9   Liver Function Tests: No results for input(s): "AST", "ALT", "ALKPHOS", "BILITOT", "PROT", "ALBUMIN" in the last 168 hours. CBG: Recent Labs  Lab 06/24/22 1155 06/24/22 1714 06/24/22 2119 06/25/22 0849 06/25/22 1230  GLUCAP 93 97 128* 98 87    Discharge time spent: greater than 30 minutes.  Signed: Jennye Boroughs, MD [facility] 06/25/2022

## 2022-07-01 DIAGNOSIS — E119 Type 2 diabetes mellitus without complications: Secondary | ICD-10-CM | POA: Diagnosis not present

## 2022-07-01 DIAGNOSIS — N1832 Chronic kidney disease, stage 3b: Secondary | ICD-10-CM | POA: Diagnosis not present

## 2022-07-01 DIAGNOSIS — G8929 Other chronic pain: Secondary | ICD-10-CM | POA: Diagnosis not present

## 2022-07-01 DIAGNOSIS — M25569 Pain in unspecified knee: Secondary | ICD-10-CM | POA: Diagnosis not present

## 2022-07-01 DIAGNOSIS — I10 Essential (primary) hypertension: Secondary | ICD-10-CM | POA: Diagnosis not present

## 2022-07-16 DIAGNOSIS — E785 Hyperlipidemia, unspecified: Secondary | ICD-10-CM | POA: Diagnosis not present

## 2022-07-16 DIAGNOSIS — G8929 Other chronic pain: Secondary | ICD-10-CM | POA: Diagnosis not present

## 2022-07-16 DIAGNOSIS — M25569 Pain in unspecified knee: Secondary | ICD-10-CM | POA: Diagnosis not present

## 2022-07-16 DIAGNOSIS — N1832 Chronic kidney disease, stage 3b: Secondary | ICD-10-CM | POA: Diagnosis not present

## 2022-07-16 DIAGNOSIS — I10 Essential (primary) hypertension: Secondary | ICD-10-CM | POA: Diagnosis not present

## 2022-07-16 DIAGNOSIS — E119 Type 2 diabetes mellitus without complications: Secondary | ICD-10-CM | POA: Diagnosis not present

## 2022-08-13 DIAGNOSIS — M1711 Unilateral primary osteoarthritis, right knee: Secondary | ICD-10-CM | POA: Diagnosis not present

## 2022-08-14 ENCOUNTER — Other Ambulatory Visit: Payer: Self-pay | Admitting: Surgery

## 2022-08-18 ENCOUNTER — Encounter
Admission: RE | Admit: 2022-08-18 | Discharge: 2022-08-18 | Disposition: A | Discharge status: Home / Self Care | Payer: PPO | Source: Ambulatory Visit | Attending: Surgery | Admitting: Surgery

## 2022-08-18 VITALS — BP 125/66 | HR 59 | Resp 14 | Ht 62.0 in | Wt 165.1 lb

## 2022-08-18 DIAGNOSIS — Z01818 Encounter for other preprocedural examination: Secondary | ICD-10-CM | POA: Diagnosis not present

## 2022-08-18 DIAGNOSIS — R8290 Unspecified abnormal findings in urine: Secondary | ICD-10-CM | POA: Insufficient documentation

## 2022-08-18 LAB — CBC WITH DIFFERENTIAL/PLATELET
Abs Immature Granulocytes: 0.01 10*3/uL (ref 0.00–0.07)
Basophils Absolute: 0.1 10*3/uL (ref 0.0–0.1)
Basophils Relative: 1 %
Eosinophils Absolute: 0.1 10*3/uL (ref 0.0–0.5)
Eosinophils Relative: 3 %
HCT: 36 % (ref 36.0–46.0)
Hemoglobin: 11.1 g/dL — ABNORMAL LOW (ref 12.0–15.0)
Immature Granulocytes: 0 %
Lymphocytes Relative: 22 %
Lymphs Abs: 1.1 10*3/uL (ref 0.7–4.0)
MCH: 27.6 pg (ref 26.0–34.0)
MCHC: 30.8 g/dL (ref 30.0–36.0)
MCV: 89.6 fL (ref 80.0–100.0)
Monocytes Absolute: 0.7 10*3/uL (ref 0.1–1.0)
Monocytes Relative: 14 %
Neutro Abs: 3 10*3/uL (ref 1.7–7.7)
Neutrophils Relative %: 60 %
Platelets: 245 10*3/uL (ref 150–400)
RBC: 4.02 MIL/uL (ref 3.87–5.11)
RDW: 14.1 % (ref 11.5–15.5)
WBC: 5 10*3/uL (ref 4.0–10.5)
nRBC: 0 % (ref 0.0–0.2)

## 2022-08-18 LAB — COMPREHENSIVE METABOLIC PANEL
ALT: 13 U/L (ref 0–44)
AST: 26 U/L (ref 15–41)
Albumin: 3.6 g/dL (ref 3.5–5.0)
Alkaline Phosphatase: 76 U/L (ref 38–126)
Anion gap: 6 (ref 5–15)
BUN: 20 mg/dL (ref 8–23)
CO2: 27 mmol/L (ref 22–32)
Calcium: 8.8 mg/dL — ABNORMAL LOW (ref 8.9–10.3)
Chloride: 106 mmol/L (ref 98–111)
Creatinine, Ser: 1.2 mg/dL — ABNORMAL HIGH (ref 0.44–1.00)
GFR, Estimated: 48 mL/min — ABNORMAL LOW (ref >60)
Glucose, Bld: 86 mg/dL (ref 70–99)
Potassium: 4.1 mmol/L (ref 3.5–5.1)
Sodium: 139 mmol/L (ref 135–145)
Total Bilirubin: 0.6 mg/dL (ref 0.3–1.2)
Total Protein: 7.4 g/dL (ref 6.5–8.1)

## 2022-08-18 LAB — URINALYSIS, ROUTINE W REFLEX MICROSCOPIC
Bacteria, UA: NONE SEEN
Bilirubin Urine: NEGATIVE
Glucose, UA: NEGATIVE mg/dL
Ketones, ur: NEGATIVE mg/dL
Nitrite: NEGATIVE
Protein, ur: NEGATIVE mg/dL
Specific Gravity, Urine: 1.02 (ref 1.005–1.030)
WBC, UA: >50 WBC/hpf — ABNORMAL HIGH (ref 0–5)
pH: 5 (ref 5.0–8.0)

## 2022-08-18 LAB — TYPE AND SCREEN
ABO/RH(D): O POS
Antibody Screen: NEGATIVE

## 2022-08-18 NOTE — Patient Instructions (Addendum)
Your procedure is scheduled on:08-26-22 Tuesday Report to the Registration Desk on the 1st floor of the [facility].Then proceed to the 2nd floor Surgery Desk To find out your arrival time, please call [phone] between 1PM - 3PM on:08-25-22 Monday If your arrival time is 6:00 am, do not arrive prior to that time as the [facility] entrance doors do not open until 6:00 am.  REMEMBER: Instructions that are not followed completely may result in serious medical risk, up to and including death; or upon the discretion of your surgeon and anesthesiologist your surgery may need to be rescheduled.  Do not eat food after midnight the night before surgery.  No gum chewing, lozengers or hard candies.  You may however, drink Water up to 2 hours before you are scheduled to arrive for your surgery. Do not drink anything within 2 hours of your scheduled arrival time.  In addition, your doctor has ordered for you to drink the provided  Gatorade G2 Drinking this carbohydrate drink up to two hours before surgery helps to reduce insulin resistance and improve patient outcomes. Please complete drinking 2 hours prior to scheduled arrival time.  Do NOT take any medication the day of surgery  One week prior to surgery: Stop Anti-inflammatories (NSAIDS) such as Advil, Aleve, Ibuprofen, Motrin, Naproxen, Naprosyn and Aspirin based products such as Excedrin, Goodys Powder, BC Powder.You may however, continue to take Tylenol if needed for pain up until the day of surgery.  Stop ANY OVER THE COUNTER supplements/vitamins NOW (08-18-22) until after surgery  No Alcohol for 24 hours before or after surgery.  No Smoking including e-cigarettes for 24 hours prior to surgery.  No chewable tobacco products for at least 6 hours prior to surgery.  No nicotine patches on the day of surgery.  Do not use any "recreational" drugs for at least a week prior to your surgery.  Please be advised that the combination of cocaine  and anesthesia may have negative outcomes, up to and including death. If you test positive for cocaine, your surgery will be cancelled.  On the morning of surgery brush your teeth with toothpaste and water, you may rinse your mouth with mouthwash if you wish. Do not swallow any toothpaste or mouthwash.  Use CHG Soap as directed on instruction sheet.  Do not wear jewelry, make-up, hairpins, clips or nail polish.  Do not wear lotions, powders, or perfumes.   Do not shave body from the neck down 48 hours prior to surgery just in case you cut yourself which could leave a site for infection.  Also, freshly shaved skin may become irritated if using the CHG soap.  Contact lenses, hearing aids and dentures may not be worn into surgery.  Do not bring valuables to the hospital. [facility] is not responsible for any missing/lost belongings or valuables.   Notify your doctor if there is any change in your medical condition (cold, fever, infection).  Wear comfortable clothing (specific to your surgery type) to the hospital.  After surgery, you can help prevent lung complications by doing breathing exercises.  Take deep breaths and cough every 1-2 hours. Your doctor may order a device called an Incentive Spirometer to help you take deep breaths. When coughing or sneezing, hold a pillow firmly against your incision with both hands. This is called "splinting." Doing this helps protect your incision. It also decreases belly discomfort.  If you are being admitted to the hospital overnight, leave your suitcase in the car. After surgery  it may be brought to your room.  If you are being discharged the day of surgery, you will not be allowed to drive home. You will need a responsible adult (18 years or older) to drive you home and stay with you that night.   If you are taking public transportation, you will need to have a responsible adult (18 years or older) with you. Please confirm with your physician  that it is acceptable to use public transportation.   Please call the Pre-admissions Testing Dept. at [phone] if you have any questions about these instructions.  Surgery Visitation Policy:  Patients undergoing a surgery or procedure may have two family members or support persons with them as long as the person is not COVID-19 positive or experiencing its symptoms.   Inpatient Visitation:    Visiting hours are 7 a.m. to 8 p.m. Up to four visitors are allowed at one time in a patient room. The visitors may rotate out with other people during the day. One designated support person (adult) may remain overnight.  MASKING: Due to an increase in RSV rates and hospitalizations, in-patient care areas in which we serve newborns, infants and children, masks will be required for teammates and visitors.  Children ages 54 and under may not visit. This policy affects the following departments only:  [facility] Postpartum area Mother Baby Unit Newborn nursery/Special care nursery  Other areas: Masks continue to be strongly recommended for patient-facing teammates, visitors and patients in all other areas. Visitation is not restricted outside of the units listed above.   How to Use an Incentive Spirometer An incentive spirometer is a tool that measures how well you are filling your lungs with each breath. Learning to take long, deep breaths using this tool can help you keep your lungs clear and active. This may help to reverse or lessen your chance of developing breathing (pulmonary) problems, especially infection. You may be asked to use a spirometer: After a surgery. If you have a lung problem or a history of smoking. After a long period of time when you have been unable to move or be active. If the spirometer includes an indicator to show the highest number that you have reached, your health care provider or respiratory therapist will help you set a goal. Keep a log of  your progress as told by your health care provider. What are the risks? Breathing too quickly may cause dizziness or cause you to pass out. Take your time so you do not get dizzy or light-headed. If you are in pain, you may need to take pain medicine before doing incentive spirometry. It is harder to take a deep breath if you are having pain. How to use your incentive spirometer  Sit up on the edge of your bed or on a chair. Hold the incentive spirometer so that it is in an upright position. Before you use the spirometer, breathe out normally. Place the mouthpiece in your mouth. Make sure your lips are closed tightly around it. Breathe in slowly and as deeply as you can through your mouth, causing the piston or the ball to rise toward the top of the chamber. Hold your breath for 3-5 seconds, or for as long as possible. If the spirometer includes a coach indicator, use this to guide you in breathing. Slow down your breathing if the indicator goes above the marked areas. Remove the mouthpiece from your mouth and breathe out normally. The piston or ball will  return to the bottom of the chamber. Rest for a few seconds, then repeat the steps 10 or more times. Take your time and take a few normal breaths between deep breaths so that you do not get dizzy or light-headed. Do this every 1-2 hours when you are awake. If the spirometer includes a goal marker to show the highest number you have reached (best effort), use this as a goal to work toward during each repetition. After each set of 10 deep breaths, cough a few times. This will help to make sure that your lungs are clear. If you have an incision on your chest or abdomen from surgery, place a pillow or a rolled-up towel firmly against the incision when you cough. This can help to reduce pain while taking deep breaths and coughing. General tips When you are able to get out of bed: Walk around often. Continue to take deep breaths and cough in order  to clear your lungs. Keep using the incentive spirometer until your health care provider says it is okay to stop using it. If you have been in the hospital, you may be told to keep using the spirometer at home. Contact a health care provider if: You are having difficulty using the spirometer. You have trouble using the spirometer as often as instructed. Your pain medicine is not giving enough relief for you to use the spirometer as told. You have a fever. Get help right away if: You develop shortness of breath. You develop a cough with bloody mucus from the lungs. You have fluid or blood coming from an incision site after you cough. Summary An incentive spirometer is a tool that can help you learn to take long, deep breaths to keep your lungs clear and active. You may be asked to use a spirometer after a surgery, if you have a lung problem or a history of smoking, or if you have been inactive for a long period of time. Use your incentive spirometer as instructed every 1-2 hours while you are awake. If you have an incision on your chest or abdomen, place a pillow or a rolled-up towel firmly against your incision when you cough. This will help to reduce pain. Get help right away if you have shortness of breath, you cough up bloody mucus, or blood comes from your incision when you cough. This information is not intended to replace advice given to you by your health care provider. Make sure you discuss any questions you have with your health care provider. Document Revised: 11/21/2019 Document Reviewed: 11/21/2019 Elsevier Patient Education  2023 [employer].

## 2022-08-20 LAB — URINE CULTURE: Culture: NO GROWTH

## 2022-08-26 ENCOUNTER — Ambulatory Visit: Payer: PPO | Admitting: Urgent Care

## 2022-08-26 ENCOUNTER — Ambulatory Visit
Admission: RE | Admit: 2022-08-26 | Discharge: 2022-08-26 | Disposition: A | Discharge status: Home / Self Care | Payer: PPO | Attending: Surgery | Admitting: Surgery

## 2022-08-26 ENCOUNTER — Encounter
Admission: RE | Disposition: A | Discharge status: Home / Self Care | Payer: Self-pay | Source: Home / Self Care | Attending: Surgery

## 2022-08-26 ENCOUNTER — Other Ambulatory Visit: Payer: Self-pay

## 2022-08-26 ENCOUNTER — Ambulatory Visit: Payer: PPO

## 2022-08-26 ENCOUNTER — Encounter: Payer: Self-pay | Admitting: Surgery

## 2022-08-26 DIAGNOSIS — Z01818 Encounter for other preprocedural examination: Secondary | ICD-10-CM

## 2022-08-26 DIAGNOSIS — R8290 Unspecified abnormal findings in urine: Secondary | ICD-10-CM

## 2022-08-26 DIAGNOSIS — Z471 Aftercare following joint replacement surgery: Secondary | ICD-10-CM | POA: Diagnosis not present

## 2022-08-26 DIAGNOSIS — Z01812 Encounter for preprocedural laboratory examination: Secondary | ICD-10-CM

## 2022-08-26 DIAGNOSIS — Z96651 Presence of right artificial knee joint: Secondary | ICD-10-CM | POA: Diagnosis not present

## 2022-08-26 DIAGNOSIS — M1711 Unilateral primary osteoarthritis, right knee: Secondary | ICD-10-CM | POA: Diagnosis not present

## 2022-08-26 DIAGNOSIS — E1122 Type 2 diabetes mellitus with diabetic chronic kidney disease: Secondary | ICD-10-CM | POA: Diagnosis not present

## 2022-08-26 DIAGNOSIS — E119 Type 2 diabetes mellitus without complications: Secondary | ICD-10-CM | POA: Diagnosis not present

## 2022-08-26 DIAGNOSIS — I129 Hypertensive chronic kidney disease with stage 1 through stage 4 chronic kidney disease, or unspecified chronic kidney disease: Secondary | ICD-10-CM | POA: Diagnosis not present

## 2022-08-26 DIAGNOSIS — N1832 Chronic kidney disease, stage 3b: Secondary | ICD-10-CM | POA: Diagnosis not present

## 2022-08-26 HISTORY — PX: TOTAL KNEE ARTHROPLASTY: SHX125

## 2022-08-26 LAB — GLUCOSE, CAPILLARY
Glucose-Capillary: 79 mg/dL (ref 70–99)
Glucose-Capillary: 104 mg/dL — ABNORMAL HIGH (ref 70–99)

## 2022-08-26 SURGERY — ARTHROPLASTY, KNEE, TOTAL
Anesthesia: Spinal | Site: Knee | Laterality: Right

## 2022-08-26 MED ORDER — CEFAZOLIN SODIUM-DEXTROSE 2-4 GM/100ML-% IV SOLN
2.0000 g | Freq: Four times a day (QID) | INTRAVENOUS | Status: DC
  Administered 2022-08-26: 2 g via INTRAVENOUS

## 2022-08-26 MED ORDER — SODIUM CHLORIDE 0.9 % IV SOLN: INTRAVENOUS | Status: DC

## 2022-08-26 MED ORDER — EPHEDRINE 5 MG/ML INJ
INTRAVENOUS | Status: AC
  Filled 2022-08-26: qty 5

## 2022-08-26 MED ORDER — TRANEXAMIC ACID 1000 MG/10ML IV SOLN
INTRAVENOUS | Status: AC
  Filled 2022-08-26: qty 10

## 2022-08-26 MED ORDER — SODIUM CHLORIDE 0.9 % IV SOLN
INTRAVENOUS | Status: DC | PRN
  Administered 2022-08-26: 60 mL

## 2022-08-26 MED ORDER — SODIUM CHLORIDE 0.9 % IR SOLN
Status: DC | PRN
  Administered 2022-08-26: 3000 mL

## 2022-08-26 MED ORDER — BUPIVACAINE HCL (PF) 0.5 % IJ SOLN
INTRAMUSCULAR | Status: DC | PRN
  Administered 2022-08-26: 2.5 mL via INTRATHECAL

## 2022-08-26 MED ORDER — SODIUM CHLORIDE 0.9 % BOLUS PEDS
250.0000 mL | Freq: Once | INTRAVENOUS | Status: AC
  Administered 2022-08-26: 250 mL via INTRAVENOUS

## 2022-08-26 MED ORDER — ACETAMINOPHEN 10 MG/ML IV SOLN
INTRAVENOUS | Status: AC
  Filled 2022-08-26: qty 100

## 2022-08-26 MED ORDER — CEFAZOLIN SODIUM-DEXTROSE 2-4 GM/100ML-% IV SOLN
INTRAVENOUS | Status: AC
  Filled 2022-08-26: qty 100

## 2022-08-26 MED ORDER — OXYCODONE HCL 5 MG PO TABS: 5.0000 mg | ORAL_TABLET | Freq: Once | ORAL | Status: DC | PRN

## 2022-08-26 MED ORDER — TRIAMCINOLONE ACETONIDE 40 MG/ML IJ SUSP
INTRAMUSCULAR | Status: AC
  Filled 2022-08-26: qty 2

## 2022-08-26 MED ORDER — METOCLOPRAMIDE HCL 5 MG/ML IJ SOLN: 5.0000 mg | Freq: Three times a day (TID) | INTRAMUSCULAR | Status: DC | PRN

## 2022-08-26 MED ORDER — KETOROLAC TROMETHAMINE 15 MG/ML IJ SOLN: 15.0000 mg | Freq: Once | INTRAMUSCULAR | Status: AC

## 2022-08-26 MED ORDER — LIDOCAINE HCL (PF) 1 % IJ SOLN
INTRAMUSCULAR | Status: DC | PRN
  Administered 2022-08-26 (×2): 2 mL via SUBCUTANEOUS

## 2022-08-26 MED ORDER — ORAL CARE MOUTH RINSE: 15.0000 mL | Freq: Once | OROMUCOSAL | Status: AC

## 2022-08-26 MED ORDER — BUPIVACAINE-EPINEPHRINE (PF) 0.5% -1:200000 IJ SOLN
INTRAMUSCULAR | Status: DC | PRN
  Administered 2022-08-26: 30 mL

## 2022-08-26 MED ORDER — TRANEXAMIC ACID 1000 MG/10ML IV SOLN
INTRAVENOUS | Status: DC | PRN
  Administered 2022-08-26: 1000 mg via TOPICAL

## 2022-08-26 MED ORDER — SODIUM CHLORIDE FLUSH 0.9 % IV SOLN
INTRAVENOUS | Status: AC
  Filled 2022-08-26: qty 40

## 2022-08-26 MED ORDER — EPHEDRINE SULFATE (PRESSORS) 50 MG/ML IJ SOLN
INTRAMUSCULAR | Status: DC | PRN
  Administered 2022-08-26: 5 mg via INTRAVENOUS

## 2022-08-26 MED ORDER — BUPIVACAINE LIPOSOME 1.3 % IJ SUSP
INTRAMUSCULAR | Status: AC
  Filled 2022-08-26: qty 20

## 2022-08-26 MED ORDER — FAMOTIDINE 20 MG PO TABS: 20.0000 mg | ORAL_TABLET | Freq: Once | ORAL | Status: AC

## 2022-08-26 MED ORDER — PROPOFOL 500 MG/50ML IV EMUL
INTRAVENOUS | Status: DC | PRN
  Administered 2022-08-26: 75 ug/kg/min via INTRAVENOUS

## 2022-08-26 MED ORDER — PROPOFOL 10 MG/ML IV BOLUS
INTRAVENOUS | Status: DC | PRN
  Administered 2022-08-26: 30 mg via INTRAVENOUS

## 2022-08-26 MED ORDER — OXYCODONE HCL 5 MG PO TABS
5.0000 mg | ORAL_TABLET | ORAL | Status: DC | PRN
  Administered 2022-08-26: 10 mg via ORAL

## 2022-08-26 MED ORDER — METOCLOPRAMIDE HCL 10 MG PO TABS: 5.0000 mg | ORAL_TABLET | Freq: Three times a day (TID) | ORAL | Status: DC | PRN

## 2022-08-26 MED ORDER — OXYCODONE HCL 5 MG/5ML PO SOLN: 5.0000 mg | Freq: Once | ORAL | Status: DC | PRN

## 2022-08-26 MED ORDER — CHLORHEXIDINE GLUCONATE 0.12 % MT SOLN
OROMUCOSAL | Status: AC
  Administered 2022-08-26: 15 mL via OROMUCOSAL
  Filled 2022-08-26: qty 15

## 2022-08-26 MED ORDER — KETOROLAC TROMETHAMINE 30 MG/ML IJ SOLN
INTRAMUSCULAR | Status: DC | PRN
  Administered 2022-08-26: 30 mg via INTRAMUSCULAR

## 2022-08-26 MED ORDER — KETOROLAC TROMETHAMINE 15 MG/ML IJ SOLN
INTRAMUSCULAR | Status: AC
  Administered 2022-08-26: 15 mg via INTRAVENOUS
  Filled 2022-08-26: qty 1

## 2022-08-26 MED ORDER — BUPIVACAINE-EPINEPHRINE (PF) 0.5% -1:200000 IJ SOLN
INTRAMUSCULAR | Status: AC
  Filled 2022-08-26: qty 30

## 2022-08-26 MED ORDER — GLYCOPYRROLATE 0.2 MG/ML IJ SOLN
INTRAMUSCULAR | Status: DC | PRN
  Administered 2022-08-26: .2 mg via INTRAVENOUS

## 2022-08-26 MED ORDER — CHLORHEXIDINE GLUCONATE 0.12 % MT SOLN: 15.0000 mL | Freq: Once | OROMUCOSAL | Status: AC

## 2022-08-26 MED ORDER — OXYCODONE HCL 5 MG PO TABS
ORAL_TABLET | ORAL | Status: AC
  Filled 2022-08-26: qty 2

## 2022-08-26 MED ORDER — LIDOCAINE HCL (PF) 1 % IJ SOLN: INTRAMUSCULAR | Status: DC | PRN

## 2022-08-26 MED ORDER — MIDAZOLAM HCL 5 MG/5ML IJ SOLN
INTRAMUSCULAR | Status: DC | PRN
  Administered 2022-08-26: 1 mg via INTRAVENOUS

## 2022-08-26 MED ORDER — TRIAMCINOLONE ACETONIDE 40 MG/ML IJ SUSP
INTRAMUSCULAR | Status: DC | PRN
  Administered 2022-08-26: 80 mg via INTRAMUSCULAR

## 2022-08-26 MED ORDER — PHENYLEPHRINE HCL-NACL 20-0.9 MG/250ML-% IV SOLN
INTRAVENOUS | Status: AC
  Filled 2022-08-26: qty 250

## 2022-08-26 MED ORDER — PHENYLEPHRINE HCL-NACL 20-0.9 MG/250ML-% IV SOLN
INTRAVENOUS | Status: DC | PRN
  Administered 2022-08-26: 20 ug/min via INTRAVENOUS

## 2022-08-26 MED ORDER — PHENYLEPHRINE 80 MCG/ML (10ML) SYRINGE FOR IV PUSH (FOR BLOOD PRESSURE SUPPORT)
PREFILLED_SYRINGE | INTRAVENOUS | Status: DC | PRN
  Administered 2022-08-26: 80 ug via INTRAVENOUS

## 2022-08-26 MED ORDER — FAMOTIDINE 20 MG PO TABS
ORAL_TABLET | ORAL | Status: AC
  Administered 2022-08-26: 20 mg via ORAL
  Filled 2022-08-26: qty 1

## 2022-08-26 MED ORDER — GLYCOPYRROLATE 0.2 MG/ML IJ SOLN
INTRAMUSCULAR | Status: AC
  Filled 2022-08-26: qty 1

## 2022-08-26 MED ORDER — APIXABAN 2.5 MG PO TABS: 2.5000 mg | ORAL_TABLET | Freq: Two times a day (BID) | ORAL | 0 refills | Status: DC

## 2022-08-26 MED ORDER — KETOROLAC TROMETHAMINE 30 MG/ML IJ SOLN
INTRAMUSCULAR | Status: AC
  Filled 2022-08-26: qty 1

## 2022-08-26 MED ORDER — FENTANYL CITRATE (PF) 100 MCG/2ML IJ SOLN: 25.0000 ug | INTRAMUSCULAR | Status: DC | PRN

## 2022-08-26 MED ORDER — SODIUM CHLORIDE 0.9 % BOLUS PEDS: 250.0000 mL | Freq: Once | INTRAVENOUS | Status: DC

## 2022-08-26 MED ORDER — ACETAMINOPHEN 10 MG/ML IV SOLN
INTRAVENOUS | Status: DC | PRN
  Administered 2022-08-26: 1000 mg via INTRAVENOUS

## 2022-08-26 MED ORDER — ONDANSETRON HCL 4 MG/2ML IJ SOLN: 4.0000 mg | Freq: Four times a day (QID) | INTRAMUSCULAR | Status: DC | PRN

## 2022-08-26 MED ORDER — CEFAZOLIN SODIUM-DEXTROSE 2-4 GM/100ML-% IV SOLN
2.0000 g | INTRAVENOUS | Status: AC
  Administered 2022-08-26: 2 g via INTRAVENOUS

## 2022-08-26 MED ORDER — PROPOFOL 1000 MG/100ML IV EMUL
INTRAVENOUS | Status: AC
  Filled 2022-08-26: qty 100

## 2022-08-26 MED ORDER — MIDAZOLAM HCL 2 MG/2ML IJ SOLN
INTRAMUSCULAR | Status: AC
  Filled 2022-08-26: qty 2

## 2022-08-26 MED ORDER — OXYCODONE HCL 5 MG PO TABS: 5.0000 mg | ORAL_TABLET | ORAL | 0 refills | Status: DC | PRN

## 2022-08-26 MED ORDER — BUPIVACAINE HCL (PF) 0.5 % IJ SOLN
INTRAMUSCULAR | Status: AC
  Filled 2022-08-26: qty 10

## 2022-08-26 MED ORDER — ONDANSETRON HCL 4 MG PO TABS: 4.0000 mg | ORAL_TABLET | Freq: Four times a day (QID) | ORAL | Status: DC | PRN

## 2022-08-26 SURGICAL SUPPLY — 66 items
AUG FEM KNEE LG CRUCIATE WING (Orthopedic Implant) ×1 IMPLANT
AUG KNEE TIBIAL 75X10 (Joint) ×1 IMPLANT
AUGMENT FEM KNEE LG CRUCI WING (Orthopedic Implant) IMPLANT
AUGMENT KNEE TIBIAL 75X10 (Joint) IMPLANT
BIT DRILL QUICK REL 1/8 2PK SL (DRILL) IMPLANT
BLADE SAW SAG 25X90X1.19 (BLADE) ×1 IMPLANT
BLADE SURG SZ20 CARB STEEL (BLADE) ×1 IMPLANT
BNDG ELASTIC 6X5.8 VLCR NS LF (GAUZE/BANDAGES/DRESSINGS) ×1 IMPLANT
CEMENT BONE R 1X40 (Cement) ×2 IMPLANT
CEMENT VACUUM MIXING SYSTEM (MISCELLANEOUS) ×1 IMPLANT
CHLORAPREP W/TINT 26 (MISCELLANEOUS) ×1 IMPLANT
COMPONENT PATELLAR VGD 7.8X3 (Joint) IMPLANT
COOLER POLAR GLACIER W/PUMP (MISCELLANEOUS) ×1 IMPLANT
COVER MAYO STAND REUSABLE (DRAPES) ×1 IMPLANT
CUFF TOURN SGL QUICK 24 (TOURNIQUET CUFF)
CUFF TOURN SGL QUICK 34 (TOURNIQUET CUFF)
CUFF TRNQT CYL 24X4X16.5-23 (TOURNIQUET CUFF) IMPLANT
CUFF TRNQT CYL 34X4.125X (TOURNIQUET CUFF) IMPLANT
DRAPE 3/4 80X56 (DRAPES) ×1 IMPLANT
DRAPE IMP U-DRAPE 54X76 (DRAPES) ×1 IMPLANT
DRAPE U-SHAPE 47X51 STRL (DRAPES) ×1 IMPLANT
DRILL QUICK RELEASE 1/8 INCH (DRILL) ×3
DRSG MEPILEX SACRM 8.7X9.8 (GAUZE/BANDAGES/DRESSINGS) IMPLANT
DRSG OPSITE POSTOP 4X10 (GAUZE/BANDAGES/DRESSINGS) ×1 IMPLANT
DRSG OPSITE POSTOP 4X8 (GAUZE/BANDAGES/DRESSINGS) ×1 IMPLANT
ELECT CAUTERY BLADE 6.4 (BLADE) IMPLANT
ELECT REM PT RETURN 9FT ADLT (ELECTROSURGICAL) ×1
ELECTRODE REM PT RTRN 9FT ADLT (ELECTROSURGICAL) ×1 IMPLANT
FEMORAL CR RIGHT 67.5MM (Joint) IMPLANT
GAUZE XEROFORM 1X8 LF (GAUZE/BANDAGES/DRESSINGS) ×1 IMPLANT
GLOVE BIO SURGEON STRL SZ7.5 (GLOVE) ×4 IMPLANT
GLOVE BIO SURGEON STRL SZ8 (GLOVE) ×4 IMPLANT
GLOVE BIOGEL PI IND STRL 8 (GLOVE) ×1 IMPLANT
GLOVE SURG UNDER LTX SZ8 (GLOVE) ×1 IMPLANT
GOWN STRL REUS W/ TWL LRG LVL3 (GOWN DISPOSABLE) ×1 IMPLANT
GOWN STRL REUS W/ TWL XL LVL3 (GOWN DISPOSABLE) ×1 IMPLANT
GOWN STRL REUS W/TWL LRG LVL3 (GOWN DISPOSABLE) ×1
GOWN STRL REUS W/TWL XL LVL3 (GOWN DISPOSABLE) ×1
HANDLE YANKAUER SUCT OPEN TIP (MISCELLANEOUS) IMPLANT
HOOD PEEL AWAY T7 (MISCELLANEOUS) ×3 IMPLANT
INSERT TIB BEARING 75 10 (Insert) IMPLANT
IV NS IRRIG 3000ML ARTHROMATIC (IV SOLUTION) ×1 IMPLANT
KIT TURNOVER KIT A (KITS) ×1 IMPLANT
MANIFOLD NEPTUNE II (INSTRUMENTS) ×1 IMPLANT
NDL SPNL 20GX3.5 QUINCKE YW (NEEDLE) ×1 IMPLANT
NEEDLE SPNL 20GX3.5 QUINCKE YW (NEEDLE) ×1 IMPLANT
NS IRRIG 1000ML POUR BTL (IV SOLUTION) ×1 IMPLANT
PACK TOTAL KNEE (MISCELLANEOUS) ×1 IMPLANT
PAD WRAPON POLAR KNEE (MISCELLANEOUS) ×1 IMPLANT
PENCIL SMOKE EVACUATOR (MISCELLANEOUS) ×1 IMPLANT
PULSAVAC PLUS IRRIG FAN TIP (DISPOSABLE) ×1
STAPLER SKIN PROX 35W (STAPLE) ×1 IMPLANT
STEM SPLINED KNEE V2 20X40 (Stem) IMPLANT
SUCTION FRAZIER HANDLE 10FR (MISCELLANEOUS) ×1
SUCTION TUBE FRAZIER 10FR DISP (MISCELLANEOUS) ×1 IMPLANT
SUT VIC AB 0 CT1 36 (SUTURE) ×3 IMPLANT
SUT VIC AB 2-0 CT1 27 (SUTURE) ×3
SUT VIC AB 2-0 CT1 TAPERPNT 27 (SUTURE) ×3 IMPLANT
SYR 10ML LL (SYRINGE) ×1 IMPLANT
SYR 20ML LL LF (SYRINGE) ×1 IMPLANT
SYR 30ML LL (SYRINGE) IMPLANT
TIP FAN IRRIG PULSAVAC PLUS (DISPOSABLE) ×1 IMPLANT
TRAP FLUID SMOKE EVACUATOR (MISCELLANEOUS) ×2 IMPLANT
TRAY TIBIAL VANGUARD 75MM (Knees) IMPLANT
WATER STERILE IRR 500ML POUR (IV SOLUTION) ×1 IMPLANT
WRAPON POLAR PAD KNEE (MISCELLANEOUS) ×1

## 2022-08-26 NOTE — Op Note (Signed)
08/26/2022  10:40 AM  Patient:   Veronica Barajas  Pre-Op Diagnosis:   Severe degenerative joint disease, right knee.  Post-Op Diagnosis:   Same  Procedure:   Right TKA using all-cemented Biomet Vanguard system with a 67.5 mm mm PCR femur; a 75 mm revision tibial tray with a  20 x 40 mm stem, a large cruciate attachment, and a 10 mm medial augment; a 10 mm anterior stabilized  E-poly insert; and a 34 x 7.8 mm all-poly 3-pegged domed patella.  Surgeon:   Maryagnes Amos, MD  Assistant:   Horris Latino, PA-C   Anesthesia:   Spinal  Findings:   As above  Complications:   None  EBL:   20 cc  Fluids:   700 cc crystalloid  UOP:   None  TT:   115 minutes at 300 mmHg  Drains:   None  Closure:   Staples  Implants:   As above  Brief Clinical Note:   The patient is a 71 year old female with a long history of progressively worsening right knee pain. The patient's symptoms have progressed despite medications, activity modification, injections, etc. The patient's history and examination were consistent with advanced degenerative joint disease of the right knee with significant medial tibial erosion confirmed by plain radiographs. The patient presents at this time for a right total knee arthroplasty.  Procedure:   The patient was brought into the operating room. After adequate spinal anesthesia was obtained, the patient was lain in the supine position before the right lower extremity was prepped with ChloraPrep solution and draped sterilely. Preoperative antibiotics were administered. A timeout was performed to verify the appropriate surgical site before the limb was exsanguinated with an Esmarch and the tourniquet inflated to 300 mmHg.   A standard anterior approach to the knee was made through an approximately 7 inch incision. The incision was carried down through the subcutaneous tissues to expose superficial retinaculum. This was split the length of the incision and the medial flap  elevated sufficiently to expose the medial retinaculum. The medial retinaculum was incised, leaving a 3-4 mm cuff of tissue on the patella. This was extended distally along the medial border of the patellar tendon and proximally through the medial third of the quadriceps tendon. A subtotal fat pad excision was performed before the soft tissues were elevated off the anteromedial and anterolateral aspects of the proximal tibia to the level of the collateral ligaments. The anterior portions of the medial and lateral menisci were removed, as was the anterior cruciate ligament. With the knee flexed to 90, the external tibial guide was positioned and the appropriate proximal tibial cut made. After the cut was made, it was noted that there was still an approximately 7 to 8 mm defect in the posterior medial aspect of the tibia. Therefore, it was determined that a revision tibial component would be necessary so that a 10 mm augment could be incorporated into the final construct.  Using the starter drill, the tibial canal was entered and subsequently dilated using the appropriate reamers up to a 10 x 40 mm reamer. This provided good cortical chatter distally. The tibial surface was measured and found to be optimal replicated by the 75 mm tray. The 75 mm tray was temporarily secured to the surface and the augment cutting guide positioned on the medial side. After securing it to the proximal tibia with the appropriate pins, the 10 mm augment cuts were made and the piece removed. The 75 mm trial tibial  tray was repositioned and the keel created using the large punch.    Attention was directed to the distal femur. The intramedullary canal was accessed through a 3/8" drill hole. The intramedullary guide was inserted and positioned in order to obtain a neutral flexion gap. The intercondylar block was positioned with care taken to avoid notching the anterior cortex of the femur. The appropriate cut was made. Next, the distal  cutting block was placed at 5 of valgus alignment. Using the 9 mm slot, the distal cut was made. The distal femur was measured and found to be optimally replicated by the 67.5 mm component. The 67.5 mm 4-in-1 cutting block was positioned and first the posterior, then the posterior chamfer, the anterior chamfer, and finally the anterior cuts were made. At this point, the posterior portions medial and lateral menisci were removed.   The trial tibial component was constructed on the back table before a trial reduction was performed using the appropriate femoral and tibial components with the 10 mm insert. This demonstrated excellent stability to varus and valgus stressing both in flexion and extension while permitting full extension. Patella tracking was assessed and found to be excellent. The patella thickness was measured and found to be 23 mm. Therefore, the appropriate cut was made. The patellar surface was measured and found to be optimally replicated by the 34 mm component. The three peg holes were drilled in place before the trial button was inserted. Patella tracking was assessed and found to be excellent, passing the "no thumb test". The lug holes were drilled into the distal femur before the trial component was removed, leaving only the tibial tray. The keel was then created using the appropriate tower, reamer, and punch.  The bony surfaces were prepared for cementing by irrigating them thoroughly with sterile saline solution via the jet lavage system. A bone plug was fashioned from some of the bone that had been removed previously and used to plug the distal femoral canal. In addition, a "cocktail" consisting of 20 cc of Exparel, 30 cc of 0.5% Sensorcaine, 1 cc of Kenalog 40 (40 mg), and 30 mg of Toradol diluted out to 90 cc with sterile saline was injected into the postero-medial and postero-lateral aspects of the knee, the medial and lateral gutter regions, and the peri-incisional tissues to help with  postoperative analgesia. In addition, a bone plug was fashioned from some of the bone that had been removed from the femur earlier and used to plug the distal femoral canal.   Meanwhile, the cement was being mixed on the back table. When it was ready, the tibial tray was cemented in first. The excess cement was removed using Personal assistant. Next, the femoral component was impacted into place. Again, the excess cement was removed using Personal assistant. The 10 mm trial insert was positioned and the knee brought into extension while the cement hardened. Finally, the patella was cemented into place and secured using the patellar clamp. Again, the excess cement was removed using Personal assistant. Once the cement had hardened, the knee was placed through a range of motion with the findings as described above. Therefore, the trial insert was removed and, after verifying that no cement had been retained posteriorly, the permanent 10 mm anterior stabilized E-polyethylene insert was positioned and secured using the appropriate key locking mechanism. Again the knee was placed through a range of motion with the findings as described above.  The wound was copiously irrigated with sterile saline solution using the jet lavage  system before the quadriceps tendon and retinacular layer were reapproximated using #0 Vicryl interrupted sutures. The superficial retinacular layer also was closed using a running #0 Vicryl suture. A total of 10 cc of transexemic acid (TXA) was injected intra-articularly before the subcutaneous tissues were closed in several layers using 2-0 Vicryl interrupted sutures. The skin was closed using staples. A sterile honeycomb dressing was applied to the skin before the leg was wrapped with an Ace wrap to accommodate the Polar Care device. The patient was then awakened and returned to the recovery room in satisfactory condition after tolerating the procedure well.  The severity of this patient's  degenerative joint disease as manifested by the significant posterior medial tibial wear required the use of a revision component.  This added an extra measure of complexity to the case and required an additional 30 minutes of surgical time to prepare the proximal tibia appropriately for this revision construct.  Therefore, I feel that the -22 modifier is medically justified in this patient.

## 2022-08-26 NOTE — TOC Progression Note (Signed)
Transition of Care [facility]) - Progression Note    Patient Details  Name: Veronica Barajas MRN: [id number] Date of Birth: 1950/10/31  [facility] CM/SW Contact  Marlowe Sax, RN Phone Number: 08/26/2022, 11:22 AM  Clinical Narrative:    Patient will need a rolling walker and 3 in 1, adapt to deliver to the bedside, [facility] is set up for [facility] services        Expected Discharge Plan and Services           Expected Discharge Date: 08/26/22                                     Social Determinants of Health (SDOH) Interventions    Readmission Risk Interventions     No data to display

## 2022-08-26 NOTE — Progress Notes (Signed)
Spinal completely resolved. Up with PT to ambulate and void. Seen by Dr Joice Lofts prior to discharge. Voices readiness to be discharged to home.

## 2022-08-26 NOTE — Evaluation (Signed)
Physical Therapy Evaluation Patient Details Name: Veronica Barajas MRN: [id number] DOB: 1951-07-03 Today's Date: 08/26/2022  History of Present Illness  Pt admitted for R TKA with history of DM, HLD, and IBS.  Clinical Impression  Pt is a pleasant 71 year old female who was admitted for R TKA. Pt performs transfers and ambulation with cga and RW. Pt demonstrates deficits with strength/mobility/pain. Performed stair training and all education given to patient and daughter. Pt agreeable to HHPT and scheduled for next date. All equipment delivered to patient at bedside. Pt demonstrates ability to perform 10 SLRs with independence, therefore does not require KI for mobility. Pt will be dc in house and does not require follow up. RN aware. Will dc current orders.       Recommendations for follow up therapy are one component of a multi-disciplinary discharge planning process, led by the attending physician.  Recommendations may be updated based on patient status, additional functional criteria and insurance authorization.  Follow Up Recommendations Home health PT      Assistance Recommended at Discharge Intermittent Supervision/Assistance  Patient can return home with the following  A little help with walking and/or transfers;A little help with bathing/dressing/bathroom;Help with stairs or ramp for entrance    Equipment Recommendations  (RW and BSC delivered to bedside)  Recommendations for Other Services       Functional Status Assessment Patient has had a recent decline in their functional status and demonstrates the ability to make significant improvements in function in a reasonable and predictable amount of time.     Precautions / Restrictions Precautions Precautions: Fall Restrictions Weight Bearing Restrictions: Yes RLE Weight Bearing: Weight bearing as tolerated      Mobility  Bed Mobility               General bed mobility comments: NT, received in recliner     Transfers Overall transfer level: Needs assistance Equipment used: Rolling walker (2 wheels) Transfers: Sit to/from Stand Sit to Stand: Min guard           General transfer comment: cues for hand placement. Once standing, upright posture noted    Ambulation/Gait Ambulation/Gait assistance: Min guard Gait Distance (Feet): 120 Feet Assistive device: Rolling walker (2 wheels) Gait Pattern/deviations: Step-to pattern       General Gait Details: ambulated with step to gait pattern with RW. Safe technique although is pain limited.  Stairs Stairs: Yes Stairs assistance: Min assist Stair Management: One rail Right, Step to pattern, Sideways Number of Stairs: 4 General stair comments: up/down 4 steps with min assist and heavy cues for sequencing. Education provided to patient and daughter for safe technique  Wheelchair Mobility    Modified Rankin (Stroke Patients Only)       Balance Overall balance assessment: Needs assistance Sitting-balance support: Feet supported Sitting balance-Leahy Scale: Normal     Standing balance support: Bilateral upper extremity supported Standing balance-Leahy Scale: Good                               Pertinent Vitals/Pain Pain Assessment Pain Assessment: 0-10 Pain Score: 9  Pain Location: R knee Pain Descriptors / Indicators: Operative site guarding Pain Intervention(s): Limited activity within patient's tolerance, Repositioned, Ice applied, Premedicated before session    Home Living Family/patient expects to be discharged to:: Private residence Living Arrangements: Children;Other relatives Available Help at Discharge: Family;Available 24 hours/day Type of Home: House Home Access: Stairs to enter Entrance  Stairs-Rails: Left;Right (too wide) Entrance Stairs-Number of Steps: 4   Home Layout: Two level;Able to live on main level with bedroom/bathroom Home Equipment: Rollator (4 wheels)      Prior Function Prior  Level of Function : Independent/Modified Independent             Mobility Comments: was using SPC for all mobility ADLs Comments: indep     Hand Dominance        Extremity/Trunk Assessment   Upper Extremity Assessment Upper Extremity Assessment: Overall WFL for tasks assessed    Lower Extremity Assessment Lower Extremity Assessment: Generalized weakness (R LE grossly 3/5)       Communication   Communication: No difficulties  Cognition Arousal/Alertness: Awake/alert Behavior During Therapy: WFL for tasks assessed/performed Overall Cognitive Status: Within Functional Limits for tasks assessed                                          General Comments      Exercises Total Joint Exercises Goniometric ROM: R knee AAROM: 5-75 degrees Other Exercises Other Exercises: seated ther-ex performed on R LE including AP, QS, SLRs, hip abd/add, and LAQ. Also performed seated knee flexion stretches. 5 reps of each and educated on frequency and duration Other Exercises: education given on adapative equipment, polar care management, and pain control   Assessment/Plan    PT Assessment All further PT needs can be met in the next venue of care  PT Problem List Decreased strength;Decreased mobility;Decreased balance;Pain;Decreased range of motion       PT Treatment Interventions      PT Goals (Current goals can be found in the Care Plan section)  Acute Rehab PT Goals Patient Stated Goal: to go home today PT Goal Formulation: All assessment and education complete, DC therapy Time For Goal Achievement: 08/26/22 Potential to Achieve Goals: Good    Frequency       Co-evaluation               AM-PAC PT "6 Clicks" Mobility  Outcome Measure Help needed turning from your back to your side while in a flat bed without using bedrails?: A Little Help needed moving from lying on your back to sitting on the side of a flat bed without using bedrails?: A  Little Help needed moving to and from a bed to a chair (including a wheelchair)?: A Little Help needed standing up from a chair using your arms (e.g., wheelchair or bedside chair)?: A Little Help needed to walk in hospital room?: A Little Help needed climbing 3-5 steps with a railing? : A Little 6 Click Score: 18    End of Session Equipment Utilized During Treatment: Gait belt Activity Tolerance: Patient tolerated treatment well Patient left: in chair;with family/visitor present Nurse Communication: Mobility status PT Visit Diagnosis: Pain;Muscle weakness (generalized) (M62.81);Difficulty in walking, not elsewhere classified (R26.2) Pain - Right/Left: Right Pain - part of body: Knee    Time: 1856-3149 PT Time Calculation (min) (ACUTE ONLY): 41 min   Charges:   PT Evaluation $PT Eval Low Complexity: 1 Low PT Treatments $Gait Training: 8-22 mins $Therapeutic Exercise: 8-22 mins        Greggory Stallion, PT, DPT, GCS [phone]   Jeret Goyer 08/26/2022, 4:24 PM

## 2022-08-26 NOTE — Anesthesia Preprocedure Evaluation (Signed)
Anesthesia Evaluation  Patient identified by MRN, date of birth, ID band Patient awake    Reviewed: Allergy & Precautions, NPO status , Patient's Chart, lab work & pertinent test results  History of Anesthesia Complications Negative for: history of anesthetic complications  Airway Mallampati: III  TM Distance: >3 FB Neck ROM: full    Dental  (+) Dental Advidsory Given, Poor Dentition   Pulmonary neg pulmonary ROS, neg shortness of breath, neg COPD   Pulmonary exam normal        Cardiovascular hypertension, (-) Past MI and (-) CABG negative cardio ROS Normal cardiovascular exam     Neuro/Psych negative neurological ROS  negative psych ROS   GI/Hepatic negative GI ROS, Neg liver ROS,,,  Endo/Other  negative endocrine ROSdiabetes    Renal/GU CRFRenal disease     Musculoskeletal   Abdominal   Peds  Hematology negative hematology ROS (+)   Anesthesia Other Findings Past Medical History: No date: Arthritis No date: Diabetes mellitus without complication (HCC) No date: Dyspnea No date: Headache No date: HLD (hyperlipidemia) No date: Hyperkalemia No date: Hypertension No date: Stage 3b chronic kidney disease (HCC)  Past Surgical History: No date: ABDOMINAL HYSTERECTOMY No date: BLADDER SUSPENSION No date: CHOLECYSTECTOMY No date: EYE SURGERY  BMI    Body Mass Index: 30.18 kg/m      Reproductive/Obstetrics negative OB ROS                             Anesthesia Physical Anesthesia Plan  ASA: 2  Anesthesia Plan: Spinal   Post-op Pain Management:    Induction:   PONV Risk Score and Plan: 2 and Propofol infusion, TIVA and Midazolam  Airway Management Planned: Natural Airway and Nasal Cannula  Additional Equipment:   Intra-op Plan:   Post-operative Plan:   Informed Consent: I have reviewed the patients History and Physical, chart, labs and discussed the procedure  including the risks, benefits and alternatives for the proposed anesthesia with the patient or authorized representative who has indicated his/her understanding and acceptance.     Dental Advisory Given  Plan Discussed with: Anesthesiologist, CRNA and Surgeon  Anesthesia Plan Comments: (Patient reports no bleeding problems and no anticoagulant use.  Plan for spinal with backup GA  Patient consented for risks of anesthesia including but not limited to:  - adverse reactions to medications - damage to eyes, teeth, lips or other oral mucosa - nerve damage due to positioning  - risk of bleeding, infection and or nerve damage from spinal that could lead to paralysis - risk of headache or failed spinal - damage to teeth, lips or other oral mucosa - sore throat or hoarseness - damage to heart, brain, nerves, lungs, other parts of body or loss of life  Patient voiced understanding.)       Anesthesia Quick Evaluation

## 2022-08-26 NOTE — Transfer of Care (Signed)
Immediate Anesthesia Transfer of Care Note  Patient: Veronica Barajas  Procedure(s) Performed: TOTAL KNEE ARTHROPLASTY (Right: Knee)  Patient Location: PACU  Anesthesia Type:General and Spinal  Level of Consciousness: drowsy and patient cooperative  Airway & Oxygen Therapy: Patient Spontanous Breathing  Post-op Assessment: Report given to RN and Post -op Vital signs reviewed and stable  Post vital signs: Reviewed and stable  Last Vitals:  Vitals Value Taken Time  BP 105/61 08/26/22 1037  Temp    Pulse 73 08/26/22 1040  Resp 15 08/26/22 1040  SpO2 98 % 08/26/22 1040  Vitals shown include unvalidated device data.  Last Pain:  Vitals:   08/26/22 0653  TempSrc: Oral  PainSc: 3          Complications: No notable events documented.

## 2022-08-26 NOTE — H&P (Signed)
History of Present Illness: Veronica Barajas is a 71 y.o. female who presents today for her surgical history and physical for upcoming right total knee arthroplasty scheduled with Dr. Joice Lofts on 08/26/2022. The patient was initially scheduled to undergo this procedure in October however her preoperative blood work demonstrated hyperkalemia and surgery was canceled. Since the patient was scheduled in October the patient at today's visit states that she has stopped all of her diabetic medication and is currently controlling her blood glucose with diet and exercise. The patient denies any changes in her medical history since her last evaluation. She denies any personal history of heart attack, stroke, asthma or COPD. No personal history of blood clots prior to surgery. Pain today is a 5 out of 10 at today's visit. She continues to report an aching and throbbing discomfort in the right knee. She is not taking meloxicam or metformin at this time.  Past Medical History: Allergic state  Arthritis  Diabetes mellitus type 2, uncomplicated (CMS-HCC)  History of chest pain  History of hemorrhoids  HSV-1 (herpes simplex virus 1) infection  Hyperlipidemia  IBS (irritable bowel syndrome)  Psoriasis   Past Surgical History: COLONOSCOPY 01/14/2011  Bladder sling  CHOLECYSTECTOMY OPEN  HYSTERECTOMY with OOPHORECTOMY Bilateral (2011)   Past Family History: Diabetes Mother  Heart disease Mother  Stroke Mother  Diabetes Maternal Aunt  Alzheimer's disease Father   Medications: acetaminophen (TYLENOL) 160 mg/5 mL elixir Take by mouth every 4 (four) hours as needed for Fever  cetirizine (ZYRTEC) 10 MG tablet Take 1 tablet (10 mg total) by mouth once daily as needed for Allergies 90 tablet 0   No current Epic-ordered facility-administered medications on file.   Allergies: Tetracycline Other (Nausea And Vomiting, Gi upset)  Guaifenesin Nausea, Vomiting and Other (See Comments)  Tetracyclines Nausea And  Vomiting   Review of Systems:  A comprehensive 14 point ROS was performed, reviewed by me today, and the pertinent orthopaedic findings are documented in the HPI.  Physical Exam: BP (!) 158/62  Ht 157.5 cm (5\' 2" )  Wt 75.3 kg (166 lb)  BMI 30.36 kg/m  General/Constitutional: The patient appears to be well-nourished, well-developed, and in no acute distress. Neuro/Psych: Normal mood and affect, oriented to person, place and time. Eyes: Non-icteric. Pupils are equal, round, and reactive to light, and exhibit synchronous movement. ENT: Unremarkable. Lymphatic: No palpable adenopathy. Respiratory: Lungs clear to auscultation, Normal chest excursion, No wheezes, and Non-labored breathing Cardiovascular: Regular rate and rhythm. No murmurs. and No edema, swelling or tenderness, except as noted in detailed exam. Integumentary: No impressive skin lesions present, except as noted in detailed exam. Musculoskeletal: Unremarkable, except as noted in detailed exam.  Right knee exam: GAIT: Patient's gait is not assessed on today's visit as she presents in a wheelchair. ALIGNMENT: significant varus > 10 degrees SKIN: unremarkable SWELLING: mild EFFUSION: small WARMTH: no warmth TENDERNESS: moderate over the medial joint line, mild along lateral joint line ROM: 10 to 90 degrees with pain at the extremes of flexion greater than extension McMURRAY'S: positive PATELLOFEMORAL: normal tracking with no peri-patellar tenderness and negative apprehension sign CREPITUS: Mild patellofemoral crepitance LACHMAN'S: negative PIVOT SHIFT: negative ANTERIOR DRAWER: negative POSTERIOR DRAWER: negative VARUS/VALGUS: positive pseudolaxity to varus stressing  Grossly, she is neurovascularly intact to the right lower extremity and foot, although there is evidence of mild chronic venous stasis changes around her ankle.  Knee Imaging: Recent AP weightbearing and merchant views of the right knee are available for  review. These films demonstrate  severe degenerative changes, primarily involving the medial compartment with 100% medial joint space narrowing. In fact, there appears to be at least 5 mm of erosion into the medial tibial plateau. Overall alignment is significant varus. No fractures, lytic lesions, or abnormal calcifications are noted.  Impression: Primary osteoarthritis of right knee.  Plan:  1. Treatment options were discussed today with the patient. 2. The patient is scheduled for a right total knee arthroplasty with Dr. Joice Lofts on 08/26/2022. 3. The risk and benefits of surgical intervention were discussed today in detail with the patient, after discussion of the risk and benefits the patient would like to proceed with a right total knee arthroplasty. 4. This document will serve as a surgical history and physical for the patient. 5. The patient will follow-up per standard postop protocol. They can call the clinic they have any questions, new symptoms develop or symptoms worsen.  The procedure was discussed with the patient, as were the potential risks (including bleeding, infection, nerve and/or blood vessel injury, persistent or recurrent pain, failure of the hardware, stiffness, dislocation, need for further surgery, blood clots, strokes, heart attacks and/or arhythmias, pneumonia, etc.) and benefits. The patient states her understanding and wishes to proceed.    H&P reviewed and patient re-examined. No changes.

## 2022-08-26 NOTE — Anesthesia Procedure Notes (Signed)
Spinal  Patient location during procedure: OR Start time: 08/26/2022 7:49 AM End time: 08/26/2022 7:58 AM Reason for block: surgical anesthesia Staffing Performed: resident/CRNA  Resident/CRNA: Lynden Oxford, CRNA Performed by: Lynden Oxford, CRNA Authorized by: Stephanie Coup, MD   Preanesthetic Checklist Completed: patient identified, IV checked, site marked, risks and benefits discussed, surgical consent, monitors and equipment checked and pre-op evaluation Spinal Block Patient position: sitting Prep: DuraPrep Patient monitoring: heart rate, cardiac monitor, continuous pulse ox and blood pressure Approach: midline Location: L3-4 Injection technique: single-shot Needle Needle type: Pencan  Needle gauge: 25 G Needle length: 9 cm Assessment Sensory level: T4 Events: CSF return

## 2022-08-26 NOTE — Progress Notes (Cosign Needed)
Patient is not able to walk the distance required to go the bathroom, or he/she is unable to safely negotiate stairs required to access the bathroom.  A 3in1 BSC will alleviate this problem  

## 2022-08-26 NOTE — Discharge Instructions (Addendum)
Orthopedic discharge instructions: May shower with intact OpSite dressing. Apply ice frequently to knee or use Polar Care. Start Eliquis 1 tablet (2.5 mg) twice daily on Wednesday, 08/27/2022, for 2 weeks, then take aspirin 325 mg twice daily for 4 weeks. Take pain medication as prescribed when needed.  May supplement with ES Tylenol if necessary. May weight-bear as tolerated on right leg - use walker for balance and support. Follow-up in 10-14 days or as scheduled.  DO NOT REMOVE GREEN TEAL BAND FOR 4 DAYS  AMBULATORY SURGERY  DISCHARGE INSTRUCTIONS   The drugs that you were given will stay in your system until tomorrow so for the next 24 hours you should not:  Drive an automobile Make any legal decisions Drink any alcoholic beverage   You may resume regular meals tomorrow.  Today it is better to start with liquids and gradually work up to solid foods.  You may eat anything you prefer, but it is better to start with liquids, then soup and crackers, and gradually work up to solid foods.   Please notify your doctor immediately if you have any unusual bleeding, trouble breathing, redness and pain at the surgery site, drainage, fever, or pain not relieved by medication.    Additional Instructions:   Please contact your physician with any problems or Same Day Surgery at [phone], Monday through Friday 6 am to 4 pm, or [facility] at [facility] number at [phone].  POLAR CARE INFORMATION  MassAdvertisement.it  How to use [facility] Polar Care [facility] Therapy System?  YouTube   ShippingScam.co.uk  OPERATING INSTRUCTIONS  Start the product With dry hands, connect the transformer to the electrical connection located on the top of the cooler. Next, plug the transformer into an appropriate electrical outlet. The unit will automatically start running at this point.  To stop the pump, disconnect electrical power.  Unplug to stop the product when not in  use. Unplugging the Polar Care unit turns it off. Always unplug immediately after use. Never leave it plugged in while unattended. Remove pad.    FIRST ADD WATER TO FILL LINE, THEN ICE---Replace ice when existing ice is almost melted  1 Discuss Treatment with your Licensed Health Care Practitioner and Use Only as Prescribed 2 Apply Insulation Barrier & Cold Therapy Pad 3 Check for Moisture 4 Inspect Skin Regularly  Tips and Trouble Shooting Usage Tips 1. Use cubed or chunked ice for optimal performance. 2. It is recommended to drain the Pad between uses. To drain the pad, hold the Pad upright with the hose pointed toward the ground. Depress the black plunger and allow water to drain out. 3. You may disconnect the Pad from the unit without removing the pad from the affected area by depressing the silver tabs on the hose coupling and gently pulling the hoses apart. The Pad and unit will seal itself and will not leak. Note: Some dripping during release is normal. 4. DO NOT RUN PUMP WITHOUT WATER! The pump in this unit is designed to run with water. Running the unit without water will cause permanent damage to the pump. 5. Unplug unit before removing lid.  TROUBLESHOOTING GUIDE Pump not running, Water not flowing to the pad, Pad is not getting cold 1. Make sure the transformer is plugged into the wall outlet. 2. Confirm that the ice and water are filled to the indicated levels. 3. Make sure there are no kinks in the pad. 4. Gently pull on the blue tube to make sure the  tube/pad junction is straight. 5. Remove the pad from the treatment site and ll it while the pad is lying at; then reapply. 6. Confirm that the pad couplings are securely attached to the unit. Listen for the double clicks (Figure 1) to confirm the pad couplings are securely attached.  Leaks    Note: Some condensation on the lines, controller, and pads is unavoidable, especially in warmer climates. 1. If using a [employer] Polar Care  Cold Therapy unit with a detachable Cold Therapy Pad, and a leak exists (other than condensation on the lines) disconnect the pad couplings. Make sure the silver tabs on the couplings are depressed before reconnecting the pad to the pump hose; then confirm both sides of the coupling are properly clicked in. 2. If the coupling continues to leak or a leak is detected in the pad itself, stop using it and call [facility] at [phone].  Cleaning After use, empty and dry the unit with a soft cloth. Warm water and mild detergent may be used occasionally to clean the pump and tubes.  WARNING: The [place] can be cold enough to cause serious injury, including full skin necrosis. Follow these Operating Instructions, and carefully read the Product Insert (see pouch on side of unit) and the Cold Therapy Pad Fitting Instructions (provided with each Cold Therapy Pad) prior to use.

## 2022-08-27 ENCOUNTER — Encounter: Payer: Self-pay | Admitting: Surgery

## 2022-08-27 DIAGNOSIS — Z9181 History of falling: Secondary | ICD-10-CM | POA: Diagnosis not present

## 2022-08-27 DIAGNOSIS — Z9049 Acquired absence of other specified parts of digestive tract: Secondary | ICD-10-CM | POA: Diagnosis not present

## 2022-08-27 DIAGNOSIS — Z96651 Presence of right artificial knee joint: Secondary | ICD-10-CM | POA: Diagnosis not present

## 2022-08-27 DIAGNOSIS — E119 Type 2 diabetes mellitus without complications: Secondary | ICD-10-CM | POA: Diagnosis not present

## 2022-08-27 DIAGNOSIS — Z471 Aftercare following joint replacement surgery: Secondary | ICD-10-CM | POA: Diagnosis not present

## 2022-08-27 DIAGNOSIS — Z7901 Long term (current) use of anticoagulants: Secondary | ICD-10-CM | POA: Diagnosis not present

## 2022-08-27 DIAGNOSIS — L409 Psoriasis, unspecified: Secondary | ICD-10-CM | POA: Diagnosis not present

## 2022-08-27 DIAGNOSIS — E785 Hyperlipidemia, unspecified: Secondary | ICD-10-CM | POA: Diagnosis not present

## 2022-08-27 DIAGNOSIS — M1990 Unspecified osteoarthritis, unspecified site: Secondary | ICD-10-CM | POA: Diagnosis not present

## 2022-08-27 DIAGNOSIS — K589 Irritable bowel syndrome without diarrhea: Secondary | ICD-10-CM | POA: Diagnosis not present

## 2022-08-28 NOTE — Anesthesia Postprocedure Evaluation (Signed)
Anesthesia Post Note  Patient: Veronica Barajas  Procedure(s) Performed: TOTAL KNEE ARTHROPLASTY (Right: Knee)  Anesthesia Type: Spinal Anesthetic complications: no Comments: Patient discharged    No notable events documented.   Last Vitals:  Vitals:   08/26/22 1415 08/26/22 1426  BP:  (!) 157/52  Pulse:  63  Resp: 18 17  Temp: 36.9 C 37.3 C  SpO2:  100%    Last Pain:  Vitals:   08/26/22 1426  TempSrc: Temporal  PainSc: 5                  Lucianne Smestad,  Deborrah Mabin R

## 2022-09-04 DIAGNOSIS — E119 Type 2 diabetes mellitus without complications: Secondary | ICD-10-CM | POA: Diagnosis not present

## 2022-09-04 DIAGNOSIS — Z471 Aftercare following joint replacement surgery: Secondary | ICD-10-CM | POA: Diagnosis not present

## 2022-09-04 DIAGNOSIS — M1990 Unspecified osteoarthritis, unspecified site: Secondary | ICD-10-CM | POA: Diagnosis not present

## 2022-09-04 DIAGNOSIS — Z96651 Presence of right artificial knee joint: Secondary | ICD-10-CM | POA: Diagnosis not present

## 2022-09-04 DIAGNOSIS — E785 Hyperlipidemia, unspecified: Secondary | ICD-10-CM | POA: Diagnosis not present

## 2022-09-04 DIAGNOSIS — K589 Irritable bowel syndrome without diarrhea: Secondary | ICD-10-CM | POA: Diagnosis not present

## 2022-09-04 DIAGNOSIS — Z7901 Long term (current) use of anticoagulants: Secondary | ICD-10-CM | POA: Diagnosis not present

## 2022-09-04 DIAGNOSIS — Z9181 History of falling: Secondary | ICD-10-CM | POA: Diagnosis not present

## 2022-09-04 DIAGNOSIS — Z9049 Acquired absence of other specified parts of digestive tract: Secondary | ICD-10-CM | POA: Diagnosis not present

## 2022-09-04 DIAGNOSIS — L409 Psoriasis, unspecified: Secondary | ICD-10-CM | POA: Diagnosis not present

## 2022-09-09 DIAGNOSIS — M1990 Unspecified osteoarthritis, unspecified site: Secondary | ICD-10-CM | POA: Diagnosis not present

## 2022-09-09 DIAGNOSIS — Z471 Aftercare following joint replacement surgery: Secondary | ICD-10-CM | POA: Diagnosis not present

## 2022-09-09 DIAGNOSIS — Z96651 Presence of right artificial knee joint: Secondary | ICD-10-CM | POA: Diagnosis not present

## 2022-09-09 DIAGNOSIS — E785 Hyperlipidemia, unspecified: Secondary | ICD-10-CM | POA: Diagnosis not present

## 2022-09-09 DIAGNOSIS — E119 Type 2 diabetes mellitus without complications: Secondary | ICD-10-CM | POA: Diagnosis not present

## 2022-09-09 DIAGNOSIS — Z7901 Long term (current) use of anticoagulants: Secondary | ICD-10-CM | POA: Diagnosis not present

## 2022-09-09 DIAGNOSIS — Z9049 Acquired absence of other specified parts of digestive tract: Secondary | ICD-10-CM | POA: Diagnosis not present

## 2022-09-09 DIAGNOSIS — K589 Irritable bowel syndrome without diarrhea: Secondary | ICD-10-CM | POA: Diagnosis not present

## 2022-09-09 DIAGNOSIS — Z9181 History of falling: Secondary | ICD-10-CM | POA: Diagnosis not present

## 2022-09-09 DIAGNOSIS — L409 Psoriasis, unspecified: Secondary | ICD-10-CM | POA: Diagnosis not present

## 2022-09-10 DIAGNOSIS — Z96651 Presence of right artificial knee joint: Secondary | ICD-10-CM | POA: Diagnosis not present

## 2022-09-10 DIAGNOSIS — M25561 Pain in right knee: Secondary | ICD-10-CM | POA: Diagnosis not present

## 2022-09-12 DIAGNOSIS — Z96651 Presence of right artificial knee joint: Secondary | ICD-10-CM | POA: Diagnosis not present

## 2022-09-12 DIAGNOSIS — M25561 Pain in right knee: Secondary | ICD-10-CM | POA: Diagnosis not present

## 2022-09-17 DIAGNOSIS — Z471 Aftercare following joint replacement surgery: Secondary | ICD-10-CM | POA: Diagnosis not present

## 2022-09-17 DIAGNOSIS — Z96651 Presence of right artificial knee joint: Secondary | ICD-10-CM | POA: Diagnosis not present

## 2022-09-17 DIAGNOSIS — M25561 Pain in right knee: Secondary | ICD-10-CM | POA: Diagnosis not present

## 2022-09-19 DIAGNOSIS — Z96651 Presence of right artificial knee joint: Secondary | ICD-10-CM | POA: Diagnosis not present

## 2022-09-19 DIAGNOSIS — M25561 Pain in right knee: Secondary | ICD-10-CM | POA: Diagnosis not present

## 2022-09-26 DIAGNOSIS — Z96651 Presence of right artificial knee joint: Secondary | ICD-10-CM | POA: Diagnosis not present

## 2022-09-29 DIAGNOSIS — M25561 Pain in right knee: Secondary | ICD-10-CM | POA: Diagnosis not present

## 2022-09-29 DIAGNOSIS — Z96651 Presence of right artificial knee joint: Secondary | ICD-10-CM | POA: Diagnosis not present

## 2022-10-01 DIAGNOSIS — M25561 Pain in right knee: Secondary | ICD-10-CM | POA: Diagnosis not present

## 2022-10-01 DIAGNOSIS — Z96651 Presence of right artificial knee joint: Secondary | ICD-10-CM | POA: Diagnosis not present

## 2022-10-06 DIAGNOSIS — Z96651 Presence of right artificial knee joint: Secondary | ICD-10-CM | POA: Diagnosis not present

## 2022-10-06 DIAGNOSIS — M25561 Pain in right knee: Secondary | ICD-10-CM | POA: Diagnosis not present

## 2022-10-08 DIAGNOSIS — M25561 Pain in right knee: Secondary | ICD-10-CM | POA: Diagnosis not present

## 2022-10-08 DIAGNOSIS — Z96651 Presence of right artificial knee joint: Secondary | ICD-10-CM | POA: Diagnosis not present

## 2022-10-10 DIAGNOSIS — Z96651 Presence of right artificial knee joint: Secondary | ICD-10-CM | POA: Diagnosis not present

## 2022-10-20 DIAGNOSIS — R399 Unspecified symptoms and signs involving the genitourinary system: Secondary | ICD-10-CM | POA: Diagnosis not present

## 2022-10-22 ENCOUNTER — Other Ambulatory Visit: Payer: Self-pay | Admitting: Surgery

## 2022-10-23 ENCOUNTER — Encounter
Admission: RE | Admit: 2022-10-23 | Discharge: 2022-10-23 | Disposition: A | Discharge status: Home / Self Care | Payer: PPO | Source: Ambulatory Visit | Attending: Surgery | Admitting: Surgery

## 2022-10-23 VITALS — Ht 62.0 in | Wt 160.0 lb

## 2022-10-23 DIAGNOSIS — Z01812 Encounter for preprocedural laboratory examination: Secondary | ICD-10-CM

## 2022-10-23 HISTORY — DX: Type 2 diabetes mellitus without complications: E11.9

## 2022-10-23 NOTE — Patient Instructions (Addendum)
Your procedure is scheduled on: Thursday, February 15 Report to the Registration Desk on the 1st floor of the [facility]. To find out your arrival time, please call [phone] between 1PM - 3PM on: Wednesday, February 14 If your arrival time is 6:00 am, do not arrive before that time as the [facility] entrance doors do not open until 6:00 am.  REMEMBER: Instructions that are not followed completely may result in serious medical risk, up to and including death; or upon the discretion of your surgeon and anesthesiologist your surgery may need to be rescheduled.  Do not eat food after midnight the night before surgery.  No gum chewing or hard candies.  You may however, drink water up to 2 hours before you are scheduled to arrive for your surgery. Do not drink anything within 2 hours of your scheduled arrival time.  In addition, your doctor has ordered for you to drink the provided:  Gatorade G2 Drinking this carbohydrate drink up to two hours before surgery helps to reduce insulin resistance and improve patient outcomes. Please complete drinking 2 hours before scheduled arrival time.  One week prior to surgery: starting today, February 8 Stop aspirin and Anti-inflammatories (NSAIDS) such as Advil, Aleve, Ibuprofen, Motrin, Naproxen, Naprosyn and Aspirin based products such as Excedrin, Goody's Powder, BC Powder. Stop ANY OVER THE COUNTER supplements until after surgery. You may however, continue to take Tylenol if needed for pain up until the day of surgery.  Continue taking all prescribed medications.  Per Dr. Roland Rack secure chat message, stop aspirin today, February 8.  TAKE ONLY THESE MEDICATIONS THE MORNING OF SURGERY WITH A SIP OF WATER:  Tylenol or oxycodone if needed for pain  No Alcohol for 24 hours before or after surgery.  No Smoking including e-cigarettes for 24 hours before surgery.  No chewable tobacco products for at least 6 hours before surgery.  No nicotine  patches on the day of surgery.  Do not use any "recreational" drugs for at least a week (preferably 2 weeks) before your surgery.  Please be advised that the combination of cocaine and anesthesia may have negative outcomes, up to and including death. If you test positive for cocaine, your surgery will be cancelled.  On the morning of surgery brush your teeth with toothpaste and water, you may rinse your mouth with mouthwash if you wish. Do not swallow any toothpaste or mouthwash.  Use CHG Soap as directed on instruction sheet.  Do not wear jewelry, make-up, hairpins, clips or nail polish.  Do not wear lotions, powders, or perfumes.   Do not shave body hair from the neck down 48 hours before surgery.  Contact lenses, hearing aids and dentures may not be worn into surgery.  Do not bring valuables to the hospital. [facility] is not responsible for any missing/lost belongings or valuables.   Notify your doctor if there is any change in your medical condition (cold, fever, infection).  Wear comfortable clothing (specific to your surgery type) to the hospital.  After surgery, you can help prevent lung complications by doing breathing exercises.  Take deep breaths and cough every 1-2 hours. Your doctor may order a device called an Incentive Spirometer to help you take deep breaths.  If you are being discharged the day of surgery, you will not be allowed to drive home. You will need a responsible individual to drive you home and stay with you for 24 hours after surgery.   If you are taking public transportation,  you will need to have a responsible individual with you.  Please call the [facility] Dept. at [phone] if you have any questions about these instructions.  Surgery Visitation Policy:  Patients undergoing a surgery or procedure may have two family members or support persons with them as long as the person is not COVID-19 positive or experiencing its symptoms.       Preparing for Surgery with CHLORHEXIDINE GLUCONATE (CHG) Soap  Chlorhexidine Gluconate (CHG) Soap  o An antiseptic cleaner that kills germs and bonds with the skin to continue killing germs even after washing  o Used for showering the night before surgery and morning of surgery  Before surgery, you can play an important role by reducing the number of germs on your skin.  CHG (Chlorhexidine gluconate) soap is an antiseptic cleanser which kills germs and bonds with the skin to continue killing germs even after washing.  Please do not use if you have an allergy to CHG or antibacterial soaps. If your skin becomes reddened/irritated stop using the CHG.  1. Shower the NIGHT BEFORE SURGERY and the MORNING OF SURGERY with CHG soap.  2. If you choose to wash your hair, wash your hair first as usual with your normal shampoo.  3. After shampooing, rinse your hair and body thoroughly to remove the shampoo.  4. Use CHG as you would any other liquid soap. You can apply CHG directly to the skin and wash gently with a scrungie or a clean washcloth.  5. Apply the CHG soap to your body only from the neck down. Do not use on open wounds or open sores. Avoid contact with your eyes, ears, mouth, and genitals (private parts). Wash face and genitals (private parts) with your normal soap.  6. Wash thoroughly, paying special attention to the area where your surgery will be performed.  7. Thoroughly rinse your body with warm water.  8. Do not shower/wash with your normal soap after using and rinsing off the CHG soap.  9. Pat yourself dry with a clean towel.  10. Wear clean pajamas to bed the night before surgery.  12. Place clean sheets on your bed the night of your first shower and do not sleep with pets.  13. Shower again with the CHG soap on the day of surgery prior to arriving at the hospital.  14. Do not apply any deodorants/lotions/powders.  15. Please wear clean clothes to the  hospital.

## 2022-10-30 ENCOUNTER — Ambulatory Visit: Admission: RE | Admit: 2022-10-30 | Payer: PPO | Source: Home / Self Care | Admitting: Surgery

## 2022-10-30 ENCOUNTER — Encounter: Admission: RE | Payer: Self-pay | Source: Home / Self Care

## 2022-10-30 SURGERY — MANIPULATION, KNEE, CLOSED
Anesthesia: Choice | Site: Knee | Laterality: Right

## 2022-11-07 DIAGNOSIS — N1832 Chronic kidney disease, stage 3b: Secondary | ICD-10-CM | POA: Diagnosis not present

## 2022-11-07 DIAGNOSIS — E119 Type 2 diabetes mellitus without complications: Secondary | ICD-10-CM | POA: Diagnosis not present

## 2022-11-07 DIAGNOSIS — E785 Hyperlipidemia, unspecified: Secondary | ICD-10-CM | POA: Diagnosis not present

## 2022-11-14 DIAGNOSIS — G8929 Other chronic pain: Secondary | ICD-10-CM | POA: Diagnosis not present

## 2022-11-14 DIAGNOSIS — E119 Type 2 diabetes mellitus without complications: Secondary | ICD-10-CM | POA: Diagnosis not present

## 2022-11-14 DIAGNOSIS — I10 Essential (primary) hypertension: Secondary | ICD-10-CM | POA: Diagnosis not present

## 2022-11-14 DIAGNOSIS — M25569 Pain in unspecified knee: Secondary | ICD-10-CM | POA: Diagnosis not present

## 2022-11-14 DIAGNOSIS — E785 Hyperlipidemia, unspecified: Secondary | ICD-10-CM | POA: Diagnosis not present

## 2022-12-03 ENCOUNTER — Other Ambulatory Visit: Payer: Self-pay | Admitting: Surgery

## 2022-12-08 DIAGNOSIS — M1711 Unilateral primary osteoarthritis, right knee: Secondary | ICD-10-CM | POA: Diagnosis not present

## 2022-12-08 DIAGNOSIS — Z96651 Presence of right artificial knee joint: Secondary | ICD-10-CM | POA: Diagnosis not present

## 2022-12-08 DIAGNOSIS — T8482XD Fibrosis due to internal orthopedic prosthetic devices, implants and grafts, subsequent encounter: Secondary | ICD-10-CM | POA: Diagnosis not present

## 2022-12-09 ENCOUNTER — Inpatient Hospital Stay: Admission: RE | Admit: 2022-12-09 | Payer: PPO | Source: Ambulatory Visit

## 2022-12-12 ENCOUNTER — Encounter
Admission: RE | Admit: 2022-12-12 | Discharge: 2022-12-12 | Disposition: A | Discharge status: Home / Self Care | Payer: PPO | Source: Ambulatory Visit | Attending: Surgery | Admitting: Surgery

## 2022-12-12 VITALS — Ht 62.0 in | Wt 168.0 lb

## 2022-12-12 DIAGNOSIS — Z01818 Encounter for other preprocedural examination: Secondary | ICD-10-CM

## 2022-12-12 NOTE — Patient Instructions (Signed)
Your procedure is scheduled on:12-23-22 Tuesday Report to the Registration Desk on the 1st floor of the [facility].Then proceed to the 2nd floor Surgery Desk To find out your arrival time, please call [phone] between 1PM - 3PM on:12-22-22 Monday If your arrival time is 6:00 am, do not arrive before that time as the [facility] entrance doors do not open until 6:00 am.  REMEMBER: Instructions that are not followed completely may result in serious medical risk, up to and including death; or upon the discretion of your surgeon and anesthesiologist your surgery may need to be rescheduled.  Do not eat food after midnight the night before surgery.  No gum chewing or hard candies.  You may however, drink Water up to 2 hours before you are scheduled to arrive for your surgery. Do not drink anything within 2 hours of your scheduled arrival time.  In addition, your doctor has ordered for you to drink the provided:  Gatorade G2 Drinking this carbohydrate drink up to two hours before surgery helps to reduce insulin resistance and improve patient outcomes. Please complete drinking 2 hours before scheduled arrival time.  One week prior to surgery: Stop Anti-inflammatories (NSAIDS) such as Advil, Aleve, Ibuprofen, Motrin, Naproxen, Naprosyn and Aspirin based products such as Excedrin, Goody's Powder, BC Powder.You may however, continue to take Tylenol if needed for pain up until the day of surgery. Stop ANY OVER THE COUNTER supplements/vitamins 7 days prior to surgery  Do NOT take any medication the day of surgery  No Alcohol for 24 hours before or after surgery.  No Smoking including e-cigarettes for 24 hours before surgery.  No chewable tobacco products for at least 6 hours before surgery.  No nicotine patches on the day of surgery.  Do not use any "recreational" drugs for at least a week (preferably 2 weeks) before your surgery.  Please be advised that the combination of cocaine and  anesthesia may have negative outcomes, up to and including death. If you test positive for cocaine, your surgery will be cancelled.  On the morning of surgery brush your teeth with toothpaste and water, you may rinse your mouth with mouthwash if you wish. Do not swallow any toothpaste or mouthwash.  Do not wear jewelry, make-up, hairpins, clips or nail polish.  Do not wear lotions, powders, or perfumes.   Do not shave body hair from the neck down 48 hours before surgery.  Contact lenses, hearing aids and dentures may not be worn into surgery.  Do not bring valuables to the hospital. [facility] is not responsible for any missing/lost belongings or valuables.   Notify your doctor if there is any change in your medical condition (cold, fever, infection).  Wear comfortable clothing (specific to your surgery type) to the hospital.  After surgery, you can help prevent lung complications by doing breathing exercises.  Take deep breaths and cough every 1-2 hours. Your doctor may order a device called an Incentive Spirometer to help you take deep breaths. When coughing or sneezing, hold a pillow firmly against your incision with both hands. This is called "splinting." Doing this helps protect your incision. It also decreases belly discomfort.  If you are being admitted to the hospital overnight, leave your suitcase in the car. After surgery it may be brought to your room.  In case of increased patient census, it may be necessary for you, the patient, to continue your postoperative care in the Same Day Surgery department.  If you are being discharged  the day of surgery, you will not be allowed to drive home. You will need a responsible individual to drive you home and stay with you for 24 hours after surgery.   If you are taking public transportation, you will need to have a responsible individual with you.  Please call the [facility] Dept. at [phone] if you have any  questions about these instructions.  Surgery Visitation Policy:  Patients having surgery or a procedure may have two visitors.  Children under the age of 38 must have an adult with them who is not the patient.  How to Use an Incentive Spirometer An incentive spirometer is a tool that measures how well you are filling your lungs with each breath. Learning to take long, deep breaths using this tool can help you keep your lungs clear and active. This may help to reverse or lessen your chance of developing breathing (pulmonary) problems, especially infection. You may be asked to use a spirometer: After a surgery. If you have a lung problem or a history of smoking. After a long period of time when you have been unable to move or be active. If the spirometer includes an indicator to show the highest number that you have reached, your health care provider or respiratory therapist will help you set a goal. Keep a log of your progress as told by your health care provider. What are the risks? Breathing too quickly may cause dizziness or cause you to pass out. Take your time so you do not get dizzy or light-headed. If you are in pain, you may need to take pain medicine before doing incentive spirometry. It is harder to take a deep breath if you are having pain. How to use your incentive spirometer  Sit up on the edge of your bed or on a chair. Hold the incentive spirometer so that it is in an upright position. Before you use the spirometer, breathe out normally. Place the mouthpiece in your mouth. Make sure your lips are closed tightly around it. Breathe in slowly and as deeply as you can through your mouth, causing the piston or the ball to rise toward the top of the chamber. Hold your breath for 3-5 seconds, or for as long as possible. If the spirometer includes a coach indicator, use this to guide you in breathing. Slow down your breathing if the indicator goes above the marked areas. Remove the  mouthpiece from your mouth and breathe out normally. The piston or ball will return to the bottom of the chamber. Rest for a few seconds, then repeat the steps 10 or more times. Take your time and take a few normal breaths between deep breaths so that you do not get dizzy or light-headed. Do this every 1-2 hours when you are awake. If the spirometer includes a goal marker to show the highest number you have reached (best effort), use this as a goal to work toward during each repetition. After each set of 10 deep breaths, cough a few times. This will help to make sure that your lungs are clear. If you have an incision on your chest or abdomen from surgery, place a pillow or a rolled-up towel firmly against the incision when you cough. This can help to reduce pain while taking deep breaths and coughing. General tips When you are able to get out of bed: Walk around often. Continue to take deep breaths and cough in order to clear your lungs. Keep using the incentive spirometer until  your health care provider says it is okay to stop using it. If you have been in the hospital, you may be told to keep using the spirometer at home. Contact a health care provider if: You are having difficulty using the spirometer. You have trouble using the spirometer as often as instructed. Your pain medicine is not giving enough relief for you to use the spirometer as told. You have a fever. Get help right away if: You develop shortness of breath. You develop a cough with bloody mucus from the lungs. You have fluid or blood coming from an incision site after you cough. Summary An incentive spirometer is a tool that can help you learn to take long, deep breaths to keep your lungs clear and active. You may be asked to use a spirometer after a surgery, if you have a lung problem or a history of smoking, or if you have been inactive for a long period of time. Use your incentive spirometer as instructed every 1-2 hours  while you are awake. If you have an incision on your chest or abdomen, place a pillow or a rolled-up towel firmly against your incision when you cough. This will help to reduce pain. Get help right away if you have shortness of breath, you cough up bloody mucus, or blood comes from your incision when you cough. This information is not intended to replace advice given to you by your health care provider. Make sure you discuss any questions you have with your health care provider. Document Revised: 11/21/2019 Document Reviewed: 11/21/2019 Elsevier Patient Education  [place].

## 2022-12-15 NOTE — Pre-Procedure Instructions (Signed)
Pt has been called repeatedly since last week unable to reach pt. Pt did have a phone interview with [place] back in February for this same procedure so hopefully pt has her instructions from then

## 2022-12-17 ENCOUNTER — Other Ambulatory Visit: Payer: Self-pay

## 2022-12-17 ENCOUNTER — Encounter: Payer: Self-pay | Admitting: Surgery

## 2022-12-17 ENCOUNTER — Encounter
Admission: RE | Disposition: A | Discharge status: Home / Self Care | Payer: Self-pay | Source: Home / Self Care | Attending: Surgery

## 2022-12-17 ENCOUNTER — Ambulatory Visit: Payer: PPO | Admitting: Urgent Care

## 2022-12-17 ENCOUNTER — Ambulatory Visit: Payer: PPO | Admitting: Family

## 2022-12-17 ENCOUNTER — Ambulatory Visit
Admission: RE | Admit: 2022-12-17 | Discharge: 2022-12-17 | Disposition: A | Discharge status: Home / Self Care | Payer: PPO | Attending: Surgery | Admitting: Surgery

## 2022-12-17 DIAGNOSIS — I129 Hypertensive chronic kidney disease with stage 1 through stage 4 chronic kidney disease, or unspecified chronic kidney disease: Secondary | ICD-10-CM | POA: Diagnosis not present

## 2022-12-17 DIAGNOSIS — N1832 Chronic kidney disease, stage 3b: Secondary | ICD-10-CM | POA: Diagnosis not present

## 2022-12-17 DIAGNOSIS — M24661 Ankylosis, right knee: Secondary | ICD-10-CM | POA: Diagnosis not present

## 2022-12-17 DIAGNOSIS — N289 Disorder of kidney and ureter, unspecified: Secondary | ICD-10-CM | POA: Insufficient documentation

## 2022-12-17 DIAGNOSIS — I10 Essential (primary) hypertension: Secondary | ICD-10-CM | POA: Insufficient documentation

## 2022-12-17 DIAGNOSIS — Z96651 Presence of right artificial knee joint: Secondary | ICD-10-CM | POA: Insufficient documentation

## 2022-12-17 DIAGNOSIS — E119 Type 2 diabetes mellitus without complications: Secondary | ICD-10-CM | POA: Diagnosis not present

## 2022-12-17 DIAGNOSIS — Z01818 Encounter for other preprocedural examination: Secondary | ICD-10-CM

## 2022-12-17 DIAGNOSIS — E1122 Type 2 diabetes mellitus with diabetic chronic kidney disease: Secondary | ICD-10-CM | POA: Diagnosis not present

## 2022-12-17 DIAGNOSIS — M1711 Unilateral primary osteoarthritis, right knee: Secondary | ICD-10-CM | POA: Insufficient documentation

## 2022-12-17 DIAGNOSIS — T8482XA Fibrosis due to internal orthopedic prosthetic devices, implants and grafts, initial encounter: Secondary | ICD-10-CM | POA: Diagnosis not present

## 2022-12-17 HISTORY — PX: KNEE CLOSED REDUCTION: SHX995

## 2022-12-17 LAB — GLUCOSE, CAPILLARY
Glucose-Capillary: 90 mg/dL (ref 70–99)
Glucose-Capillary: 123 mg/dL — ABNORMAL HIGH (ref 70–99)

## 2022-12-17 SURGERY — MANIPULATION, KNEE, CLOSED
Anesthesia: General | Site: Knee | Laterality: Right

## 2022-12-17 MED ORDER — FENTANYL CITRATE (PF) 100 MCG/2ML IJ SOLN
INTRAMUSCULAR | Status: AC
  Filled 2022-12-17: qty 2

## 2022-12-17 MED ORDER — MIDAZOLAM HCL 2 MG/2ML IJ SOLN
INTRAMUSCULAR | Status: AC
  Filled 2022-12-17: qty 2

## 2022-12-17 MED ORDER — METOCLOPRAMIDE HCL 10 MG PO TABS: 5.0000 mg | ORAL_TABLET | Freq: Three times a day (TID) | ORAL | Status: DC | PRN

## 2022-12-17 MED ORDER — ONDANSETRON HCL 4 MG/2ML IJ SOLN: 4.0000 mg | Freq: Four times a day (QID) | INTRAMUSCULAR | Status: DC | PRN

## 2022-12-17 MED ORDER — MIDAZOLAM HCL 2 MG/2ML IJ SOLN
INTRAMUSCULAR | Status: DC | PRN
  Administered 2022-12-17: 2 mg via INTRAVENOUS

## 2022-12-17 MED ORDER — OXYCODONE HCL 5 MG PO TABS: 5.0000 mg | ORAL_TABLET | ORAL | 0 refills | Status: AC | PRN

## 2022-12-17 MED ORDER — OXYCODONE HCL 5 MG PO TABS
ORAL_TABLET | ORAL | Status: AC
  Filled 2022-12-17: qty 1

## 2022-12-17 MED ORDER — TRIAMCINOLONE ACETONIDE 40 MG/ML IJ SUSP
INTRAMUSCULAR | Status: DC | PRN
  Administered 2022-12-17: 10 mL

## 2022-12-17 MED ORDER — FAMOTIDINE 20 MG PO TABS: 20.0000 mg | ORAL_TABLET | Freq: Once | ORAL | Status: AC

## 2022-12-17 MED ORDER — PROPOFOL 10 MG/ML IV BOLUS
INTRAVENOUS | Status: AC
  Filled 2022-12-17: qty 40

## 2022-12-17 MED ORDER — OXYCODONE HCL 5 MG PO TABS
5.0000 mg | ORAL_TABLET | Freq: Once | ORAL | Status: AC | PRN
  Administered 2022-12-17: 5 mg via ORAL

## 2022-12-17 MED ORDER — METOCLOPRAMIDE HCL 5 MG/ML IJ SOLN: 5.0000 mg | Freq: Three times a day (TID) | INTRAMUSCULAR | Status: DC | PRN

## 2022-12-17 MED ORDER — OXYCODONE HCL 5 MG PO TABS: 5.0000 mg | ORAL_TABLET | ORAL | Status: DC | PRN

## 2022-12-17 MED ORDER — PROPOFOL 10 MG/ML IV BOLUS
INTRAVENOUS | Status: AC
  Filled 2022-12-17: qty 20

## 2022-12-17 MED ORDER — SODIUM CHLORIDE 0.9 % IV SOLN: INTRAVENOUS | Status: DC

## 2022-12-17 MED ORDER — ORAL CARE MOUTH RINSE: 15.0000 mL | Freq: Once | OROMUCOSAL | Status: AC

## 2022-12-17 MED ORDER — FENTANYL CITRATE (PF) 100 MCG/2ML IJ SOLN
INTRAMUSCULAR | Status: DC | PRN
  Administered 2022-12-17: 25 ug via INTRAVENOUS

## 2022-12-17 MED ORDER — CEFAZOLIN SODIUM-DEXTROSE 2-4 GM/100ML-% IV SOLN
2.0000 g | INTRAVENOUS | Status: AC
  Administered 2022-12-17: 2 g via INTRAVENOUS

## 2022-12-17 MED ORDER — FENTANYL CITRATE (PF) 100 MCG/2ML IJ SOLN
25.0000 ug | INTRAMUSCULAR | Status: DC | PRN
  Administered 2022-12-17: 25 ug via INTRAVENOUS

## 2022-12-17 MED ORDER — TRIAMCINOLONE ACETONIDE 40 MG/ML IJ SUSP
INTRAMUSCULAR | Status: AC
  Filled 2022-12-17: qty 1

## 2022-12-17 MED ORDER — EPINEPHRINE PF 1 MG/ML IJ SOLN
INTRAMUSCULAR | Status: AC
  Filled 2022-12-17: qty 1

## 2022-12-17 MED ORDER — KETOROLAC TROMETHAMINE 15 MG/ML IJ SOLN
INTRAMUSCULAR | Status: AC
  Filled 2022-12-17: qty 1

## 2022-12-17 MED ORDER — ACETAMINOPHEN 325 MG PO TABS: 325.0000 mg | ORAL_TABLET | Freq: Four times a day (QID) | ORAL | Status: DC | PRN

## 2022-12-17 MED ORDER — CHLORHEXIDINE GLUCONATE 0.12 % MT SOLN
OROMUCOSAL | Status: AC
  Administered 2022-12-17: 15 mL via OROMUCOSAL
  Filled 2022-12-17: qty 15

## 2022-12-17 MED ORDER — CEFAZOLIN SODIUM-DEXTROSE 2-4 GM/100ML-% IV SOLN
INTRAVENOUS | Status: AC
  Filled 2022-12-17: qty 100

## 2022-12-17 MED ORDER — ONDANSETRON HCL 4 MG PO TABS: 4.0000 mg | ORAL_TABLET | Freq: Four times a day (QID) | ORAL | Status: DC | PRN

## 2022-12-17 MED ORDER — BUPIVACAINE HCL (PF) 0.5 % IJ SOLN
INTRAMUSCULAR | Status: AC
  Filled 2022-12-17: qty 30

## 2022-12-17 MED ORDER — PROPOFOL 500 MG/50ML IV EMUL
INTRAVENOUS | Status: DC | PRN
  Administered 2022-12-17: 100 ug/kg/min via INTRAVENOUS

## 2022-12-17 MED ORDER — FAMOTIDINE 20 MG PO TABS
ORAL_TABLET | ORAL | Status: AC
  Administered 2022-12-17: 20 mg via ORAL
  Filled 2022-12-17: qty 1

## 2022-12-17 MED ORDER — CHLORHEXIDINE GLUCONATE 0.12 % MT SOLN: 15.0000 mL | Freq: Once | OROMUCOSAL | Status: AC

## 2022-12-17 MED ORDER — KETOROLAC TROMETHAMINE 15 MG/ML IJ SOLN
15.0000 mg | Freq: Once | INTRAMUSCULAR | Status: AC
  Administered 2022-12-17: 15 mg via INTRAVENOUS

## 2022-12-17 MED ORDER — OXYCODONE HCL 5 MG/5ML PO SOLN: 5.0000 mg | Freq: Once | ORAL | Status: AC | PRN

## 2022-12-17 SURGICAL SUPPLY — 4 items
NDL HYPO 22X1.5 SAFETY MO (MISCELLANEOUS) IMPLANT
NEEDLE HYPO 22X1.5 SAFETY MO (MISCELLANEOUS) ×1 IMPLANT
PAD ALCOHOL SWAB (MISCELLANEOUS) IMPLANT
SYR 10ML LL (SYRINGE) IMPLANT

## 2022-12-17 NOTE — H&P (Signed)
History of Present Illness: Veronica Barajas is a 72 y.o. female who presents today for her surgical history and physical for upcoming right knee manipulation under anesthesia with steroid injection. The patient is scheduled with Dr. Roland Rack on 12/17/2022. The patient denies any changes in her medical history since she was last evaluated. She denies any personal history of heart attack, stroke, asthma or COPD. No personal history of blood clots. The patient denies any falls or injury affecting the right knee since her last evaluation. Pain score today is a 3 out of 10. The patient does report increased stiffness in the right knee as she has stopped formal physical therapy in anticipation of this upcoming procedure.  Past Medical History: Allergic state  Arthritis  Diabetes mellitus type 2, uncomplicated (CMS-HCC)  History of chest pain  History of hemorrhoids  HSV-1 (herpes simplex virus 1) infection  Hyperlipidemia  IBS (irritable bowel syndrome)  Psoriasis   Past Surgical History: COLONOSCOPY 01/14/2011  Right Total Knee Arthroplasty Right 08/26/2022 (Dr.Bassheva Flury)  Bladder sling  CHOLECYSTECTOMY OPEN  HYSTERECTOMY 2011  OOPHORECTOMY Bilateral 2011   Past Family History: Diabetes Mother  Heart disease Mother  Stroke Mother  Diabetes Maternal Aunt  Alzheimer's disease Father   Medications: acetaminophen (TYLENOL) 160 mg/5 mL elixir Take by mouth every 4 (four) hours as needed for Fever  aspirin 325 MG tablet Take 325 mg by mouth 2 (two) times daily  cetirizine (ZYRTEC) 10 MG tablet Take 1 tablet (10 mg total) by mouth once daily as needed for Allergies 90 tablet 0  oxyCODONE (ROXICODONE) 5 MG immediate release tablet Take 1 tablet (5 mg total) by mouth every 6 (six) hours as needed for Pain 30 tablet 0   Allergies: Tetracycline Other (Nausea And Vomiting, GI upset)  Guaifenesin (Nausea, Vomiting)   Review of Systems:  A comprehensive 14 point ROS was performed, reviewed by me today, and  the pertinent orthopaedic findings are documented in the HPI.  Physical Exam: BP 122/68  Ht 157.5 cm (5\' 2" )  Wt 76.2 kg (168 lb)  BMI 30.73 kg/m  General/Constitutional: The patient appears to be well-nourished, well-developed, and in no acute distress. Neuro/Psych: Normal mood and affect, oriented to person, place and time. Eyes: Non-icteric. Pupils are equal, round, and reactive to light, and exhibit synchronous movement. ENT: Unremarkable. Lymphatic: No palpable adenopathy. Respiratory: Lungs clear to auscultation, Normal chest excursion, No wheezes, and Non-labored breathing Cardiovascular: Regular rate and rhythm. No murmurs. and No edema, swelling or tenderness, except as noted in detailed exam. Integumentary: No impressive skin lesions present, except as noted in detailed exam. Musculoskeletal: Unremarkable, except as noted in detailed exam.  Right knee exam: The patient ambulates with a mild limp, favoring her right leg, and uses a cane for balance and support. Skin inspection of the right knee demonstrates her surgical incision is well-healed and without evidence for infection. There is mild swelling around the knee, but no erythema, ecchymosis, abrasions, or other skin abnormalities are identified. She has at most a trace effusion. Actively, she lacks 5 degrees of extension but is able to flex her knee to 65 degrees. Passively, the knee can be flexed to 75 degrees with discomfort anteriorly. She has mild tenderness over the anterior lateral aspects of the knee. Her patella tracks well and is without crepitance. The knee is stable to varus and valgus stressing. She is neurovascularly intact to the right lower extremity and foot.   Imaging: None.  Impression: 1. Arthrofibrosis of total knee replacement. 2. Status  post total knee replacement using cement, right. 3. Primary osteoarthritis of right knee.  Plan:  1. Treatment options were discussed today with the patient. 2. The  patient is scheduled for a right total knee manipulation under anesthesia with steroid injection with Dr. Roland Rack on 12/17/2022. 3. The patient was instructed on the risk and benefits of surgical intervention and wishes to proceed at this time. 4. This document will serve as a surgical history and physical for the patient. 5. The patient will follow-up per standard postop protocol. They can call the clinic they have any questions, new symptoms develop or symptoms worsen.  The procedure was discussed with the patient, as were the potential risks (including bleeding, infection, nerve and/or blood vessel injury, inability to break up scar tissue, fracture, need for further surgery, blood clots, strokes, heart attacks and/or arhythmias, pneumonia, etc.) and benefits. The patient states her understanding and wishes to proceed.    H&P reviewed and patient re-examined. No changes.

## 2022-12-17 NOTE — Discharge Instructions (Addendum)
Orthopedic discharge instructions: May shower tonight. Apply ice frequently to knee. Take ibuprofen 600-800 mg TID with meals for 3-5 days, then as necessary. Take pain medication as prescribed or ES Tylenol when needed.  May weight-bear as tolerated - use crutches or walker as needed. Begin physical therapy tomorrow as scheduled. Follow-up in 10-14 days or as scheduled.   AMBULATORY SURGERY  DISCHARGE INSTRUCTIONS   The drugs that you were given will stay in your system until tomorrow so for the next 24 hours you should not:  Drive an automobile Make any legal decisions Drink any alcoholic beverage   You may resume regular meals tomorrow.  Today it is better to start with liquids and gradually work up to solid foods.  You may eat anything you prefer, but it is better to start with liquids, then soup and crackers, and gradually work up to solid foods.   Please notify your doctor immediately if you have any unusual bleeding, trouble breathing, redness and pain at the surgery site, drainage, fever, or pain not relieved by medication.    Additional Instructions:        Please contact your physician with any problems or Same Day Surgery at [phone], Monday through Friday 6 am to 4 pm, or [place] at [facility] number at [phone].

## 2022-12-17 NOTE — Anesthesia Postprocedure Evaluation (Signed)
Anesthesia Post Note  Patient: Veronica Barajas  Procedure(s) Performed: CLOSED MANIPULATION KNEE UNDER ANESTHESIA WITH STEROID INJECTION (Right: Knee)  Patient location during evaluation: PACU Anesthesia Type: General Level of consciousness: awake and alert Pain management: pain level controlled Vital Signs Assessment: post-procedure vital signs reviewed and stable Respiratory status: spontaneous breathing, nonlabored ventilation, respiratory function stable and patient connected to nasal cannula oxygen Cardiovascular status: blood pressure returned to baseline and stable Postop Assessment: no apparent nausea or vomiting Anesthetic complications: no   No notable events documented.   Last Vitals:  Vitals:   12/17/22 0922 12/17/22 0933  BP:  (!) 144/51  Pulse: (!) 56 60  Resp: 18 18  Temp: 36.9 C (!) 36.2 C  SpO2: 98% 99%    Last Pain:  Vitals:   12/17/22 0933  TempSrc: Temporal  PainSc: 2                  Ilene Qua

## 2022-12-17 NOTE — Transfer of Care (Signed)
Immediate Anesthesia Transfer of Care Note  Patient: Veronica Barajas  Procedure(s) Performed: CLOSED MANIPULATION KNEE UNDER ANESTHESIA WITH STEROID INJECTION (Right: Knee)  Patient Location: PACU  Anesthesia Type:General  Level of Consciousness: awake, alert , oriented, and patient cooperative  Airway & Oxygen Therapy: Patient Spontanous Breathing and Patient connected to face mask oxygen  Post-op Assessment: Report given to RN and Post -op Vital signs reviewed and stable  Post vital signs: Reviewed and stable  Last Vitals:  Vitals Value Taken Time  BP 151/59 12/17/22 0853  Temp 36.2 C 12/17/22 0853  Pulse 60 12/17/22 0857  Resp 20 12/17/22 0857  SpO2 99 % 12/17/22 0857  Vitals shown include unvalidated device data.  Last Pain:  Vitals:   12/17/22 0853  TempSrc:   PainSc: 0-No pain         Complications: No notable events documented.

## 2022-12-17 NOTE — Op Note (Signed)
12/17/2022  8:42 AM  Patient:   Veronica Barajas  Pre-Op Diagnosis:   Arthrofibrosis status post right total knee replacement.  Post-Op Diagnosis:   Same  Procedure:   Manipulation under anesthesia with steroid injection, right knee.  Surgeon:   Pascal Lux, MD  Assistant:   None  Anesthesia:   IV sedation  Findings:   As above.  Complications:   None  Fluids:   100 cc crystalloid  EBL:   0 cc  UOP:   None  TT:   None  Drains:   None  Closure:   None necessary  Brief Clinical Note:   The patient is a 72 year old female who is now 4 months status post a right total knee arthroplasty.  Despite extensive physical therapy, the patient has been unable to regain her range of motion.  She was advised to undergo a manipulation under anesthesia at her 6 week follow-up appointment.  Initially she agreed, but had to reschedule due to an issue at home.  She continues to note difficulty with knee range of motion and presents at this time for a manipulation under anesthesia with steroid injection of her right knee.  Procedure:   The patient was brought into the operating room and laid in the supine position.  After adequate IV sedation was achieved, the knee was gently manipulated.  Several palpable and audible pops were heard as the knee was manipulated.  Prior to manipulation, the knee could be ranged from 10 to 65 degrees.  After manipulation, the knee can be ranged from 5 to 125 degrees.  The knee was then injected sterilely using a solution of 1 cc of Kenalog 40 and 9 cc of 0.5% Sensorcaine with epinephrine.  The patient was then awakened and returned to the recovery room in satisfactory condition after tolerating the procedure well.

## 2022-12-17 NOTE — Anesthesia Preprocedure Evaluation (Signed)
Anesthesia Evaluation  Patient identified by MRN, date of birth, ID band Patient awake    Reviewed: Allergy & Precautions, NPO status , Patient's Chart, lab work & pertinent test results  History of Anesthesia Complications Negative for: history of anesthetic complications  Airway Mallampati: II  TM Distance: >3 FB Neck ROM: full    Dental  (+) Chipped   Pulmonary neg pulmonary ROS   Pulmonary exam normal        Cardiovascular hypertension, Normal cardiovascular exam     Neuro/Psych negative neurological ROS  negative psych ROS   GI/Hepatic negative GI ROS, Neg liver ROS,,,  Endo/Other  diabetes    Renal/GU Renal disease  negative genitourinary   Musculoskeletal   Abdominal   Peds  Hematology negative hematology ROS (+)   Anesthesia Other Findings Past Medical History: No date: Arthritis No date: Controlled type 2 diabetes mellitus without complication No date: Dyspnea No date: Headache No date: HLD (hyperlipidemia) No date: Hyperkalemia No date: Hypertension No date: Stage 3b chronic kidney disease  Past Surgical History: 2011: ABDOMINAL HYSTERECTOMY No date: BLADDER SUSPENSION No date: CATARACT EXTRACTION W/ INTRAOCULAR LENS  IMPLANT, BILATERAL;  Bilateral No date: CHOLECYSTECTOMY 2012: COLONOSCOPY 08/26/2022: TOTAL KNEE ARTHROPLASTY; Right     Comment:  Procedure: TOTAL KNEE ARTHROPLASTY;  Surgeon: Corky Mull, MD;  Location: [facility] ORS;  Service: Orthopedics;                Laterality: Right;  BMI    Body Mass Index: 30.36 kg/m      Reproductive/Obstetrics negative OB ROS                             Anesthesia Physical Anesthesia Plan  ASA: 2  Anesthesia Plan: General   Post-op Pain Management: Ofirmev IV (intra-op)*   Induction: Intravenous  PONV Risk Score and Plan: 2 and Propofol infusion and TIVA  Airway Management Planned: Natural Airway  and Nasal Cannula  Additional Equipment:   Intra-op Plan:   Post-operative Plan:   Informed Consent: I have reviewed the patients History and Physical, chart, labs and discussed the procedure including the risks, benefits and alternatives for the proposed anesthesia with the patient or authorized representative who has indicated his/her understanding and acceptance.     Dental Advisory Given  Plan Discussed with: Anesthesiologist, CRNA and Surgeon  Anesthesia Plan Comments: (Patient consented for risks of anesthesia including but not limited to:  - adverse reactions to medications - risk of airway placement if required - damage to eyes, teeth, lips or other oral mucosa - nerve damage due to positioning  - sore throat or hoarseness - Damage to heart, brain, nerves, lungs, other parts of body or loss of life  Patient voiced understanding.)       Anesthesia Quick Evaluation

## 2022-12-18 DIAGNOSIS — M25561 Pain in right knee: Secondary | ICD-10-CM | POA: Diagnosis not present

## 2022-12-18 DIAGNOSIS — M25661 Stiffness of right knee, not elsewhere classified: Secondary | ICD-10-CM | POA: Diagnosis not present

## 2022-12-18 DIAGNOSIS — M6281 Muscle weakness (generalized): Secondary | ICD-10-CM | POA: Diagnosis not present

## 2022-12-18 DIAGNOSIS — Z96651 Presence of right artificial knee joint: Secondary | ICD-10-CM | POA: Diagnosis not present

## 2022-12-19 DIAGNOSIS — Z96651 Presence of right artificial knee joint: Secondary | ICD-10-CM | POA: Diagnosis not present

## 2022-12-23 DIAGNOSIS — M25561 Pain in right knee: Secondary | ICD-10-CM | POA: Diagnosis not present

## 2022-12-23 DIAGNOSIS — Z96651 Presence of right artificial knee joint: Secondary | ICD-10-CM | POA: Diagnosis not present

## 2022-12-24 DIAGNOSIS — J029 Acute pharyngitis, unspecified: Secondary | ICD-10-CM | POA: Diagnosis not present

## 2022-12-26 DIAGNOSIS — Z96651 Presence of right artificial knee joint: Secondary | ICD-10-CM | POA: Diagnosis not present

## 2022-12-26 DIAGNOSIS — M25561 Pain in right knee: Secondary | ICD-10-CM | POA: Diagnosis not present

## 2022-12-30 DIAGNOSIS — Z96651 Presence of right artificial knee joint: Secondary | ICD-10-CM | POA: Diagnosis not present

## 2023-01-01 DIAGNOSIS — Z96651 Presence of right artificial knee joint: Secondary | ICD-10-CM | POA: Diagnosis not present

## 2023-01-01 DIAGNOSIS — M25561 Pain in right knee: Secondary | ICD-10-CM | POA: Diagnosis not present

## 2023-01-05 DIAGNOSIS — M25561 Pain in right knee: Secondary | ICD-10-CM | POA: Diagnosis not present

## 2023-01-05 DIAGNOSIS — Z96651 Presence of right artificial knee joint: Secondary | ICD-10-CM | POA: Diagnosis not present

## 2023-01-07 DIAGNOSIS — Z96651 Presence of right artificial knee joint: Secondary | ICD-10-CM | POA: Diagnosis not present

## 2023-01-09 DIAGNOSIS — Z96651 Presence of right artificial knee joint: Secondary | ICD-10-CM | POA: Diagnosis not present

## 2023-01-09 DIAGNOSIS — M25561 Pain in right knee: Secondary | ICD-10-CM | POA: Diagnosis not present

## 2023-01-12 DIAGNOSIS — M25561 Pain in right knee: Secondary | ICD-10-CM | POA: Diagnosis not present

## 2023-01-12 DIAGNOSIS — Z96651 Presence of right artificial knee joint: Secondary | ICD-10-CM | POA: Diagnosis not present

## 2023-01-14 DIAGNOSIS — M25561 Pain in right knee: Secondary | ICD-10-CM | POA: Diagnosis not present

## 2023-01-14 DIAGNOSIS — Z96651 Presence of right artificial knee joint: Secondary | ICD-10-CM | POA: Diagnosis not present

## 2023-01-16 DIAGNOSIS — Z96651 Presence of right artificial knee joint: Secondary | ICD-10-CM | POA: Diagnosis not present

## 2023-01-16 DIAGNOSIS — M25561 Pain in right knee: Secondary | ICD-10-CM | POA: Diagnosis not present

## 2023-01-19 DIAGNOSIS — Z96651 Presence of right artificial knee joint: Secondary | ICD-10-CM | POA: Diagnosis not present

## 2023-01-19 DIAGNOSIS — M25561 Pain in right knee: Secondary | ICD-10-CM | POA: Diagnosis not present

## 2023-01-26 DIAGNOSIS — M25561 Pain in right knee: Secondary | ICD-10-CM | POA: Diagnosis not present

## 2023-01-26 DIAGNOSIS — Z96651 Presence of right artificial knee joint: Secondary | ICD-10-CM | POA: Diagnosis not present

## 2023-01-28 DIAGNOSIS — Z96651 Presence of right artificial knee joint: Secondary | ICD-10-CM | POA: Diagnosis not present

## 2023-01-28 DIAGNOSIS — M25561 Pain in right knee: Secondary | ICD-10-CM | POA: Diagnosis not present

## 2023-02-02 DIAGNOSIS — Z96651 Presence of right artificial knee joint: Secondary | ICD-10-CM | POA: Diagnosis not present

## 2023-02-02 DIAGNOSIS — M25561 Pain in right knee: Secondary | ICD-10-CM | POA: Diagnosis not present

## 2023-02-12 DIAGNOSIS — Z96651 Presence of right artificial knee joint: Secondary | ICD-10-CM | POA: Diagnosis not present

## 2023-02-16 DIAGNOSIS — Z96651 Presence of right artificial knee joint: Secondary | ICD-10-CM | POA: Diagnosis not present

## 2023-02-16 DIAGNOSIS — M25561 Pain in right knee: Secondary | ICD-10-CM | POA: Diagnosis not present

## 2023-02-27 ENCOUNTER — Encounter: Payer: Self-pay | Admitting: Surgery

## 2023-02-27 NOTE — Addendum Note (Signed)
Addendum  created 02/27/23 1150 by Stormy Fabian, CRNA   Intraprocedure Event edited, Intraprocedure Staff edited

## 2023-03-03 DIAGNOSIS — E119 Type 2 diabetes mellitus without complications: Secondary | ICD-10-CM | POA: Diagnosis not present

## 2023-03-03 DIAGNOSIS — I10 Essential (primary) hypertension: Secondary | ICD-10-CM | POA: Diagnosis not present

## 2023-03-03 DIAGNOSIS — E785 Hyperlipidemia, unspecified: Secondary | ICD-10-CM | POA: Diagnosis not present

## 2023-03-03 DIAGNOSIS — R399 Unspecified symptoms and signs involving the genitourinary system: Secondary | ICD-10-CM | POA: Diagnosis not present

## 2023-03-03 DIAGNOSIS — G8929 Other chronic pain: Secondary | ICD-10-CM | POA: Diagnosis not present

## 2023-03-03 DIAGNOSIS — Z1231 Encounter for screening mammogram for malignant neoplasm of breast: Secondary | ICD-10-CM | POA: Diagnosis not present

## 2023-03-03 DIAGNOSIS — M25569 Pain in unspecified knee: Secondary | ICD-10-CM | POA: Diagnosis not present

## 2023-03-03 DIAGNOSIS — Z0000 Encounter for general adult medical examination without abnormal findings: Secondary | ICD-10-CM | POA: Diagnosis not present

## 2023-03-03 DIAGNOSIS — N1832 Chronic kidney disease, stage 3b: Secondary | ICD-10-CM | POA: Diagnosis not present

## 2023-03-24 ENCOUNTER — Other Ambulatory Visit: Payer: Self-pay

## 2023-03-24 DIAGNOSIS — Z1231 Encounter for screening mammogram for malignant neoplasm of breast: Secondary | ICD-10-CM

## 2023-05-26 ENCOUNTER — Ambulatory Visit
Admission: RE | Admit: 2023-05-26 | Discharge: 2023-05-26 | Disposition: A | Discharge status: Home / Self Care | Payer: PPO | Source: Ambulatory Visit | Attending: Family Medicine | Admitting: Family Medicine

## 2023-05-26 DIAGNOSIS — Z1231 Encounter for screening mammogram for malignant neoplasm of breast: Secondary | ICD-10-CM | POA: Diagnosis not present

## 2023-05-27 ENCOUNTER — Inpatient Hospital Stay
Admission: RE | Admit: 2023-05-27 | Discharge: 2023-05-27 | Disposition: A | Discharge status: Home / Self Care | Payer: Self-pay | Source: Ambulatory Visit | Attending: Family Medicine | Admitting: Family Medicine

## 2023-05-27 ENCOUNTER — Other Ambulatory Visit: Payer: Self-pay

## 2023-05-27 DIAGNOSIS — Z1231 Encounter for screening mammogram for malignant neoplasm of breast: Secondary | ICD-10-CM

## 2023-07-20 DIAGNOSIS — K219 Gastro-esophageal reflux disease without esophagitis: Secondary | ICD-10-CM | POA: Diagnosis not present

## 2023-07-20 DIAGNOSIS — Z1211 Encounter for screening for malignant neoplasm of colon: Secondary | ICD-10-CM | POA: Diagnosis not present

## 2023-07-20 DIAGNOSIS — D649 Anemia, unspecified: Secondary | ICD-10-CM | POA: Diagnosis not present

## 2023-07-20 DIAGNOSIS — R194 Change in bowel habit: Secondary | ICD-10-CM | POA: Diagnosis not present

## 2023-08-05 ENCOUNTER — Ambulatory Visit: Payer: PPO

## 2023-08-05 DIAGNOSIS — K640 First degree hemorrhoids: Secondary | ICD-10-CM | POA: Diagnosis not present

## 2023-08-05 DIAGNOSIS — K5730 Diverticulosis of large intestine without perforation or abscess without bleeding: Secondary | ICD-10-CM | POA: Diagnosis not present

## 2023-08-05 DIAGNOSIS — K2100 Gastro-esophageal reflux disease with esophagitis, without bleeding: Secondary | ICD-10-CM | POA: Diagnosis not present

## 2023-08-05 DIAGNOSIS — K2970 Gastritis, unspecified, without bleeding: Secondary | ICD-10-CM | POA: Diagnosis not present

## 2023-08-05 DIAGNOSIS — K449 Diaphragmatic hernia without obstruction or gangrene: Secondary | ICD-10-CM | POA: Diagnosis not present

## 2023-08-05 DIAGNOSIS — D509 Iron deficiency anemia, unspecified: Secondary | ICD-10-CM | POA: Diagnosis not present

## 2023-09-14 DIAGNOSIS — I10 Essential (primary) hypertension: Secondary | ICD-10-CM | POA: Diagnosis not present

## 2023-09-14 DIAGNOSIS — E119 Type 2 diabetes mellitus without complications: Secondary | ICD-10-CM | POA: Diagnosis not present

## 2023-09-14 DIAGNOSIS — J209 Acute bronchitis, unspecified: Secondary | ICD-10-CM | POA: Diagnosis not present

## 2023-10-13 DIAGNOSIS — G8929 Other chronic pain: Secondary | ICD-10-CM | POA: Diagnosis not present

## 2023-10-13 DIAGNOSIS — M25569 Pain in unspecified knee: Secondary | ICD-10-CM | POA: Diagnosis not present

## 2023-10-13 DIAGNOSIS — N1832 Chronic kidney disease, stage 3b: Secondary | ICD-10-CM | POA: Diagnosis not present

## 2023-10-13 DIAGNOSIS — E785 Hyperlipidemia, unspecified: Secondary | ICD-10-CM | POA: Diagnosis not present

## 2023-10-13 DIAGNOSIS — E119 Type 2 diabetes mellitus without complications: Secondary | ICD-10-CM | POA: Diagnosis not present

## 2023-10-13 DIAGNOSIS — I10 Essential (primary) hypertension: Secondary | ICD-10-CM | POA: Diagnosis not present

## 2023-11-04 DIAGNOSIS — E119 Type 2 diabetes mellitus without complications: Secondary | ICD-10-CM | POA: Diagnosis not present

## 2023-12-11 DIAGNOSIS — R052 Subacute cough: Secondary | ICD-10-CM | POA: Diagnosis not present

## 2023-12-11 DIAGNOSIS — R61 Generalized hyperhidrosis: Secondary | ICD-10-CM | POA: Diagnosis not present

## 2024-02-12 ENCOUNTER — Other Ambulatory Visit
Admission: RE | Admit: 2024-02-12 | Discharge: 2024-02-12 | Disposition: A | Discharge status: Home / Self Care | Source: Ambulatory Visit | Attending: Family Medicine | Admitting: Family Medicine

## 2024-02-12 ENCOUNTER — Emergency Department

## 2024-02-12 ENCOUNTER — Emergency Department
Admission: EM | Admit: 2024-02-12 | Discharge: 2024-02-12 | Disposition: A | Discharge status: Home / Self Care | Attending: Emergency Medicine | Admitting: Emergency Medicine

## 2024-02-12 ENCOUNTER — Other Ambulatory Visit: Payer: Self-pay

## 2024-02-12 DIAGNOSIS — E1122 Type 2 diabetes mellitus with diabetic chronic kidney disease: Secondary | ICD-10-CM | POA: Diagnosis not present

## 2024-02-12 DIAGNOSIS — N1832 Chronic kidney disease, stage 3b: Secondary | ICD-10-CM | POA: Insufficient documentation

## 2024-02-12 DIAGNOSIS — M79662 Pain in left lower leg: Secondary | ICD-10-CM | POA: Diagnosis not present

## 2024-02-12 DIAGNOSIS — M7989 Other specified soft tissue disorders: Secondary | ICD-10-CM | POA: Diagnosis not present

## 2024-02-12 DIAGNOSIS — L03116 Cellulitis of left lower limb: Secondary | ICD-10-CM | POA: Insufficient documentation

## 2024-02-12 DIAGNOSIS — R600 Localized edema: Secondary | ICD-10-CM | POA: Diagnosis not present

## 2024-02-12 LAB — D-DIMER, QUANTITATIVE: D-Dimer, Quant: 1.97 ug{FEU}/mL — ABNORMAL HIGH (ref 0.00–0.50)

## 2024-02-12 NOTE — ED Triage Notes (Signed)
 Pt to ED via POV from home. Pt reports having swelling, pain, warmth and redness to left leg x7-10days. Pt seen at [facility] and dx with cellulitis after possible bug bite and given medication.  Had blood work done at [facility] and d-dimer was elevated. Denies CP or SOB.Ricky Charter for DVT rule out.

## 2024-02-12 NOTE — Discharge Instructions (Addendum)
 You have been seen today in the Emergency Department (ED) for cellulitis, a superficial skin infection.  We also ruled out a blood clot in your leg. Please take your antibiotics as prescribed for their ENTIRE prescribed duration.  Take Tylenol  as needed for pain, but only as written on the box.   Please follow up with your doctor or in the ED in 24-48 hours for recheck of your infection if you are not improving.  Call your doctor sooner or return to the ED if you develop worsening signs of infection such as: increased redness, increased pain, pus, fever, chest pain, shortness of breath, dyspnea or other symptoms that concern you.

## 2024-02-12 NOTE — ED Provider Notes (Signed)
 [facility] Provider Note    Event Date/Time   First MD Initiated Contact with Patient 02/12/24 1734     (approximate)   History   Leg Swelling   HPI  Veronica Barajas is a 73 y.o. female presenting to the emergency department today for swelling, pain, warmth, and erythema to her left leg x 7 days.  She was seen at [facility] clinic today and diagnosed with cellulitis after possible bug bite and given Bactrim .  She also received blood work there which showed an elevated D-dimer, and they asked her to come to the emergency department to rule out a DVT.  Denies chest pain, shortness of breath, dyspnea, abdominal pain, back pain, palpitations. Patient does have a past medical history of controlled type 2 diabetes mellitus, stage IIIb CKD.     Physical Exam   Triage Vital Signs: ED Triage Vitals  Encounter Vitals Group     BP 02/12/24 1603 138/79     Systolic BP Percentile --      Diastolic BP Percentile --      Pulse Rate 02/12/24 1603 75     Resp 02/12/24 1603 20     Temp 02/12/24 1603 99 F (37.2 C)     Temp Source 02/12/24 1603 Oral     SpO2 02/12/24 1603 97 %     Weight --      Height --      Head Circumference --      Peak Flow --      Pain Score 02/12/24 1604 6     Pain Loc --      Pain Education --      Exclude from Growth Chart --     Most recent vital signs: Vitals:   02/12/24 1603  BP: 138/79  Pulse: 75  Resp: 20  Temp: 99 F (37.2 C)  SpO2: 97%   General: Well-appearing, in no acute distress. Appears stated age. Head: Normocephalic, atraumatic. Eyes: No scleral icterus or conjunctival injection. CV: Regular rate. No murmurs, rubs, or gallops. Peripheral pulses 2+ and symmetric.  Respiratory: Breath sounds clear b/l. No wheezes, rales, or rhonchi. No respiratory distress. Normal respiratory effort. GI: Soft, non-distended. MSK: Normal range of motion and strength 5 out of 5 in bilateral ankles and toes.  Good sensation  throughout b/l lower extremities. Skin: 2+ edema, erythema, tender to palpation of left lower anterior leg and ankle.  Negative Homans' sign bilaterally. Bilateral lower extremity shows signs of varicose veins.   ED Results / Procedures / Treatments   Labs (all labs ordered are listed, but only abnormal results are displayed) Labs Reviewed - No data to display   EKG   RADIOLOGY  DVT left lower extremity ultrasound ordered.  No DVT observed.  PROCEDURES:  Critical Care performed: No  Procedures   MEDICATIONS ORDERED IN ED: Medications - No data to display   IMPRESSION / MDM / ASSESSMENT AND PLAN / ED COURSE  I reviewed the triage vital signs and the nursing notes.                              Differential diagnosis includes, but is not limited to, cellulitis, abscess, DVT, PE  Patient's presentation is most consistent with acute presentation with potential threat to life or bodily function.  Patient was sent over from [facility] clinic for here for DVT rule out.  Doppler left lower ultrasound was ordered, no  evidence of DVT.  CBC from [facility] clinic shows white blood cell count of 4.7 and D-dimer of 1.97.  Was placed on Bactrim  at [facility] clinic for cellulitis.  Patient also has CKD stage IIIb.  Although DVT was ruled out, elevated D-dimer makes me suspicious for PE.  She currently denies chest pain, palpitations, dyspnea, shortness of breath.  All vital signs are within normal range.  I consulted Dr. Alejo Amsler, MD, regarding this patient whether to order a CT angio contrast for PE rule out with concern that it may worsen her current CKD.  We used age-adjusted D-dimer for VTE on med Calc to determine her risk, and it stated VTE is unlikely at her age.  I also had concerns regarding her taking Bactrim  with potential worsening kidney function; she is allergic to doxycycline with nausea and vomiting allergy.  Dr. Alejo Amsler stated the Bactrim  should be okay so long as she takes 8 ounces  of water at minimum with each dose.  I discussed with her the importance of hydration as well as finishing the entire antibiotic.  Emergency department return precautions were discussed with the patient.  Patient is in agreement to the treatment plan.  Patient is stable for discharge.    FINAL CLINICAL IMPRESSION(S) / ED DIAGNOSES   Final diagnoses:  Cellulitis of left leg     Rx / DC Orders   ED Discharge Orders     None        Note:  This document was prepared using Dragon voice recognition software and may include unintentional dictation errors.    Jorie Newness [place], PA-C 02/12/24 1828    Bradler, Evan K, MD 02/13/24 [id number]

## 2024-02-25 DIAGNOSIS — I872 Venous insufficiency (chronic) (peripheral): Secondary | ICD-10-CM | POA: Diagnosis not present

## 2024-02-25 DIAGNOSIS — L03116 Cellulitis of left lower limb: Secondary | ICD-10-CM | POA: Diagnosis not present

## 2024-02-25 DIAGNOSIS — N1832 Chronic kidney disease, stage 3b: Secondary | ICD-10-CM | POA: Diagnosis not present

## 2024-02-25 DIAGNOSIS — E1122 Type 2 diabetes mellitus with diabetic chronic kidney disease: Secondary | ICD-10-CM | POA: Diagnosis not present

## 2024-03-04 DIAGNOSIS — M1712 Unilateral primary osteoarthritis, left knee: Secondary | ICD-10-CM | POA: Diagnosis not present

## 2024-03-04 DIAGNOSIS — L03116 Cellulitis of left lower limb: Secondary | ICD-10-CM | POA: Diagnosis not present

## 2024-03-15 DIAGNOSIS — L03116 Cellulitis of left lower limb: Secondary | ICD-10-CM | POA: Diagnosis not present

## 2024-03-22 DIAGNOSIS — I8312 Varicose veins of left lower extremity with inflammation: Secondary | ICD-10-CM | POA: Diagnosis not present

## 2024-03-22 DIAGNOSIS — L0390 Cellulitis, unspecified: Secondary | ICD-10-CM | POA: Diagnosis not present

## 2024-03-29 DIAGNOSIS — L0390 Cellulitis, unspecified: Secondary | ICD-10-CM | POA: Diagnosis not present

## 2024-04-05 DIAGNOSIS — L0390 Cellulitis, unspecified: Secondary | ICD-10-CM | POA: Diagnosis not present

## 2024-04-12 DIAGNOSIS — L0390 Cellulitis, unspecified: Secondary | ICD-10-CM | POA: Diagnosis not present

## 2024-04-20 DIAGNOSIS — I8312 Varicose veins of left lower extremity with inflammation: Secondary | ICD-10-CM | POA: Diagnosis not present

## 2024-05-19 DIAGNOSIS — I10 Essential (primary) hypertension: Secondary | ICD-10-CM | POA: Diagnosis not present

## 2024-05-19 DIAGNOSIS — E119 Type 2 diabetes mellitus without complications: Secondary | ICD-10-CM | POA: Diagnosis not present

## 2024-05-19 DIAGNOSIS — E785 Hyperlipidemia, unspecified: Secondary | ICD-10-CM | POA: Diagnosis not present

## 2024-05-26 DIAGNOSIS — E119 Type 2 diabetes mellitus without complications: Secondary | ICD-10-CM | POA: Diagnosis not present

## 2024-05-26 DIAGNOSIS — Z1331 Encounter for screening for depression: Secondary | ICD-10-CM | POA: Diagnosis not present

## 2024-05-26 DIAGNOSIS — Z0000 Encounter for general adult medical examination without abnormal findings: Secondary | ICD-10-CM | POA: Diagnosis not present

## 2024-05-26 DIAGNOSIS — I10 Essential (primary) hypertension: Secondary | ICD-10-CM | POA: Diagnosis not present

## 2024-05-26 DIAGNOSIS — E785 Hyperlipidemia, unspecified: Secondary | ICD-10-CM | POA: Diagnosis not present

## 2024-06-07 ENCOUNTER — Other Ambulatory Visit: Payer: Self-pay | Admitting: Family Medicine

## 2024-06-07 DIAGNOSIS — Z1231 Encounter for screening mammogram for malignant neoplasm of breast: Secondary | ICD-10-CM

## 2024-06-17 DIAGNOSIS — H35371 Puckering of macula, right eye: Secondary | ICD-10-CM | POA: Diagnosis not present

## 2024-06-17 DIAGNOSIS — E119 Type 2 diabetes mellitus without complications: Secondary | ICD-10-CM | POA: Diagnosis not present

## 2024-06-29 ENCOUNTER — Ambulatory Visit
Admission: RE | Admit: 2024-06-29 | Discharge: 2024-06-29 | Disposition: A | Discharge status: Home / Self Care | Source: Ambulatory Visit | Attending: Family Medicine | Admitting: Family Medicine

## 2024-06-29 DIAGNOSIS — Z1231 Encounter for screening mammogram for malignant neoplasm of breast: Secondary | ICD-10-CM | POA: Insufficient documentation

## 2024-07-21 DIAGNOSIS — I872 Venous insufficiency (chronic) (peripheral): Secondary | ICD-10-CM | POA: Diagnosis not present

## 2024-07-21 DIAGNOSIS — I8312 Varicose veins of left lower extremity with inflammation: Secondary | ICD-10-CM | POA: Diagnosis not present
